# Patient Record
Sex: Female | Born: 1940 | Race: White | Hispanic: No | Smoking: Never smoker
Health system: Southern US, Community
[De-identification: ages and names within clinical notes are randomized; demographics above are authoritative.]

## PROBLEM LIST (undated history)

## (undated) DIAGNOSIS — C801 Malignant (primary) neoplasm, unspecified: Secondary | ICD-10-CM

## (undated) DIAGNOSIS — T7840XA Allergy, unspecified, initial encounter: Secondary | ICD-10-CM

## (undated) DIAGNOSIS — M1990 Unspecified osteoarthritis, unspecified site: Secondary | ICD-10-CM

## (undated) DIAGNOSIS — J45909 Unspecified asthma, uncomplicated: Secondary | ICD-10-CM

## (undated) DIAGNOSIS — Z5189 Encounter for other specified aftercare: Secondary | ICD-10-CM

## (undated) DIAGNOSIS — I10 Essential (primary) hypertension: Secondary | ICD-10-CM

## (undated) DIAGNOSIS — E785 Hyperlipidemia, unspecified: Secondary | ICD-10-CM

## (undated) DIAGNOSIS — E079 Disorder of thyroid, unspecified: Secondary | ICD-10-CM

## (undated) DIAGNOSIS — I890 Lymphedema, not elsewhere classified: Secondary | ICD-10-CM

## (undated) DIAGNOSIS — I83893 Varicose veins of bilateral lower extremities with other complications: Secondary | ICD-10-CM

## (undated) DIAGNOSIS — E119 Type 2 diabetes mellitus without complications: Secondary | ICD-10-CM

## (undated) HISTORY — PX: CHOLECYSTECTOMY: SHX55

## (undated) HISTORY — DX: Type 2 diabetes mellitus without complications: E11.9

## (undated) HISTORY — PX: ABDOMINAL HYSTERECTOMY: SHX81

## (undated) HISTORY — DX: Unspecified asthma, uncomplicated: J45.909

## (undated) HISTORY — DX: Encounter for other specified aftercare: Z51.89

## (undated) HISTORY — PX: COSMETIC SURGERY: SHX468

## (undated) HISTORY — PX: MASTECTOMY: SHX3

## (undated) HISTORY — DX: Malignant (primary) neoplasm, unspecified: C80.1

## (undated) HISTORY — DX: Hyperlipidemia, unspecified: E78.5

## (undated) HISTORY — DX: Unspecified osteoarthritis, unspecified site: M19.90

## (undated) HISTORY — DX: Varicose veins of bilateral lower extremities with other complications: I83.893

## (undated) HISTORY — DX: Lymphedema, not elsewhere classified: I89.0

## (undated) HISTORY — DX: Allergy, unspecified, initial encounter: T78.40XA

## (undated) HISTORY — PX: BREAST SURGERY: SHX581

---

## 2016-01-29 ENCOUNTER — Encounter (HOSPITAL_COMMUNITY): Payer: Self-pay | Admitting: Emergency Medicine

## 2016-01-29 ENCOUNTER — Emergency Department (HOSPITAL_COMMUNITY): Payer: Medicare (Managed Care)

## 2016-01-29 ENCOUNTER — Emergency Department (HOSPITAL_COMMUNITY)
Admission: EM | Admit: 2016-01-29 | Discharge: 2016-01-29 | Disposition: A | Discharge status: Home / Self Care | Payer: Medicare (Managed Care) | Attending: Emergency Medicine | Admitting: Emergency Medicine

## 2016-01-29 DIAGNOSIS — W25XXXA Contact with sharp glass, initial encounter: Secondary | ICD-10-CM | POA: Diagnosis not present

## 2016-01-29 DIAGNOSIS — Y929 Unspecified place or not applicable: Secondary | ICD-10-CM | POA: Diagnosis not present

## 2016-01-29 DIAGNOSIS — S90851A Superficial foreign body, right foot, initial encounter: Secondary | ICD-10-CM | POA: Diagnosis not present

## 2016-01-29 DIAGNOSIS — Z23 Encounter for immunization: Secondary | ICD-10-CM | POA: Diagnosis not present

## 2016-01-29 DIAGNOSIS — I10 Essential (primary) hypertension: Secondary | ICD-10-CM | POA: Insufficient documentation

## 2016-01-29 DIAGNOSIS — Y93G3 Activity, cooking and baking: Secondary | ICD-10-CM | POA: Insufficient documentation

## 2016-01-29 DIAGNOSIS — Y999 Unspecified external cause status: Secondary | ICD-10-CM | POA: Insufficient documentation

## 2016-01-29 HISTORY — DX: Essential (primary) hypertension: I10

## 2016-01-29 HISTORY — DX: Disorder of thyroid, unspecified: E07.9

## 2016-01-29 MED ORDER — BACITRACIN ZINC 500 UNIT/GM EX OINT
TOPICAL_OINTMENT | CUTANEOUS | Status: AC
  Filled 2016-01-29: qty 0.9

## 2016-01-29 MED ORDER — LIDOCAINE-EPINEPHRINE (PF) 2 %-1:200000 IJ SOLN
10.0000 mL | Freq: Once | INTRAMUSCULAR | Status: AC
  Administered 2016-01-29: 10 mL via INTRADERMAL
  Filled 2016-01-29: qty 20

## 2016-01-29 MED ORDER — TETANUS-DIPHTH-ACELL PERTUSSIS 5-2.5-18.5 LF-MCG/0.5 IM SUSP
0.5000 mL | Freq: Once | INTRAMUSCULAR | Status: AC
  Administered 2016-01-29: 0.5 mL via INTRAMUSCULAR
  Filled 2016-01-29: qty 0.5

## 2016-01-29 MED ORDER — BACITRACIN ZINC 500 UNIT/GM EX OINT
TOPICAL_OINTMENT | Freq: Once | CUTANEOUS | Status: AC
  Administered 2016-01-29: 1 via TOPICAL

## 2016-01-29 NOTE — ED Provider Notes (Signed)
[facility] DEPT Provider Note   CSN: [id number] Arrival date & time: 01/29/16  [id number]  First Provider Contact:  None       History   Chief Complaint Chief Complaint  Patient presents with  . Foreign Body    HPI Teresa Anderson is a 75 y.o. female.  HPI   Patient is a 75 year old female with a history of HTN and thyroid disease whr presents the ED with foreign body in right heel. She states she stepped on something 30 minutes PTA. She is experiencing mild intermittent pain at rest, pain is worse with ambulation or pressure on her heel. No associated symptoms. She's not tried anything for the pain. She states her neighbor has tried to remove the foreign body without success. She denies numbness/timing, weakness, fever.  Past Medical History:  Diagnosis Date  . Hypertension   . Thyroid disease     There are no active problems to display for this patient.   Past Surgical History:  Procedure Laterality Date  . ABDOMINAL HYSTERECTOMY    . CESAREAN SECTION     x4  . CHOLECYSTECTOMY    . MASTECTOMY     bilateral    OB History    No data available       Home Medications    Prior to Admission medications   Not on File    Family History History reviewed. No pertinent family history.  Social History Social History  Substance Use Topics  . Smoking status: Never Smoker  . Smokeless tobacco: Never Used  . Alcohol use No     Allergies   Demerol [meperidine]; Morphine and related; Penicillins; and Sulfa antibiotics   Review of Systems Review of Systems  Constitutional: Negative for fever.  Gastrointestinal: Negative for nausea and vomiting.  Skin: Positive for wound.  Neurological: Negative for weakness and numbness.     Physical Exam Updated Vital Signs Pulse 84   Temp 97.8 F (36.6 C) (Oral)   Resp 16   Ht 5\' 4"  (1.626 m)   Wt 90.7 kg   SpO2 100%   BMI 34.33 kg/m   Physical Exam  Constitutional: She appears well-developed and  well-nourished. No distress.  HENT:  Head: Normocephalic and atraumatic.  Eyes: Conjunctivae are normal.  Pulmonary/Chest: Effort normal. No respiratory distress.  Musculoskeletal: Normal range of motion.  Puncture wound noted to right heel that is TTP, full range of motion of ankle and toes, no surrounding erythema, edema or warmth, no signs of infection, patient neurovascularly intact distally  Neurological: She is alert. Coordination normal.  Skin: Skin is warm and dry. She is not diaphoretic.  Psychiatric: She has a normal mood and affect. Her behavior is normal.  Nursing note and vitals reviewed.    ED Treatments / Results  Labs (all labs ordered are listed, but only abnormal results are displayed) Labs Reviewed - No data to display  EKG  EKG Interpretation None       Radiology Dg Foot Complete Right  Result Date: 01/29/2016 CLINICAL DATA:  Right heel pain. Stepped on glass today with glass in foot. EXAM: RIGHT FOOT COMPLETE - 3+ VIEW COMPARISON:  None. FINDINGS: There is a 6 mm foreign body in the plantar subcutaneous tissues subjacent to the mid calcaneus. No tracking soft tissue air. No acute osseous abnormalities. There is midfoot degenerative change and a prominent plantar calcaneal spur. Hammertoe deformity of second toe. IMPRESSION: 6 mm foreign body in the plantar soft tissues subjacent to the calcaneus. Electronically Signed  By: Jeb Levering M.D.   On: 01/29/2016 19:20    Procedures .Foreign Body Removal Date/Time: 01/29/2016 9:01 PM Performed by: Claris Gower, Sameeha Rockefeller L Authorized by: Jackson Latino L  Consent: Verbal consent obtained. Risks and benefits: risks, benefits and alternatives were discussed Consent given by: patient Patient understanding: patient states understanding of the procedure being performed Required items: required blood products, implants, devices, and special equipment available Patient identity confirmed: verbally with patient and arm  band Time out: Immediately prior to procedure a "time out" was called to verify the correct patient, procedure, equipment, support staff and site/side marked as required. Body area: skin General location: lower extremity Location details: right foot Anesthesia: local infiltration  Anesthesia: Local Anesthetic: lidocaine 2% with epinephrine Anesthetic total: 2 mL  Sedation: Patient sedated: no Patient cooperative: yes Localization method: visualized (xray) Removal mechanism: forceps Dressing: antibiotic ointment and dressing applied Depth: subcutaneous Complexity: simple 1 objects recovered. Objects recovered: glass Post-procedure assessment: foreign body removed Patient tolerance: Patient tolerated the procedure well with no immediate complications   (including critical care time)  Medications Ordered in ED Medications  bacitracin 500 UNIT/GM ointment (not administered)  Tdap (BOOSTRIX) injection 0.5 mL (0.5 mLs Intramuscular Given 01/29/16 1928)  lidocaine-EPINEPHrine (XYLOCAINE W/EPI) 2 %-1:200000 (PF) injection 10 mL (10 mLs Intradermal Given 01/29/16 2048)  bacitracin ointment (1 application Topical Given 01/29/16 2048)     Initial Impression / Assessment and Plan / ED Course  I have reviewed the triage vital signs and the nursing notes.  Pertinent labs & imaging results that were available during my care of the patient were reviewed by me and considered in my medical decision making (see chart for details).  Clinical Course   Patient presents with foreign body in bottom of right foot. Foreign body visualized on x-ray. Was able to remove the foreign body. Pt tolerated the procedure well. Discussed signs of infection and reasons to return immediately to the ER. Instructed patient to follow-up at the [facility] in 2 days to have wound rechecked. Patient expressed understanding to the discharge instructions.  Final Clinical Impressions(s) /  ED Diagnoses   Final diagnoses:  Foreign body in right foot, initial encounter    New Prescriptions There are no discharge medications for this patient.    Kalman Drape, PA 01/29/16 2106    Milton Ferguson, MD 01/30/16 1524

## 2016-01-29 NOTE — Discharge Instructions (Signed)
Keep your wound clean and dry and do not submerge in a tub, pool or body of water until completely healed. Change your bandage twice daily and use bacitracin ointment. Follow up with the [facility] in 2 days to have your wound rechecked.  Return to the emergency department if you experience signs of infection to include redness, swelling, pain, discharge, fever or any other concerning symptoms.

## 2016-01-29 NOTE — ED Triage Notes (Signed)
Pt reports was cooking dinner and reported stepped on glass. Laceration ntoed to left heel. Pt reports "it still feels like its in my foot." dried blood noted to bottom of left foot. Pt reports last tetanus shot x10 years.

## 2016-03-16 ENCOUNTER — Encounter: Payer: Self-pay | Admitting: Family Medicine

## 2016-03-16 ENCOUNTER — Ambulatory Visit (INDEPENDENT_AMBULATORY_CARE_PROVIDER_SITE_OTHER): Payer: Medicare Other | Admitting: Family Medicine

## 2016-03-16 VITALS — BP 138/74 | HR 64 | Resp 18 | Ht 63.5 in | Wt 214.0 lb

## 2016-03-16 DIAGNOSIS — Z96653 Presence of artificial knee joint, bilateral: Secondary | ICD-10-CM

## 2016-03-16 DIAGNOSIS — I890 Lymphedema, not elsewhere classified: Secondary | ICD-10-CM

## 2016-03-16 DIAGNOSIS — E039 Hypothyroidism, unspecified: Secondary | ICD-10-CM | POA: Diagnosis not present

## 2016-03-16 DIAGNOSIS — Z7189 Other specified counseling: Secondary | ICD-10-CM | POA: Diagnosis not present

## 2016-03-16 DIAGNOSIS — I10 Essential (primary) hypertension: Secondary | ICD-10-CM | POA: Diagnosis not present

## 2016-03-16 DIAGNOSIS — I83813 Varicose veins of bilateral lower extremities with pain: Secondary | ICD-10-CM

## 2016-03-16 DIAGNOSIS — E669 Obesity, unspecified: Secondary | ICD-10-CM

## 2016-03-16 DIAGNOSIS — G894 Chronic pain syndrome: Secondary | ICD-10-CM

## 2016-03-16 DIAGNOSIS — Z7689 Persons encountering health services in other specified circumstances: Secondary | ICD-10-CM

## 2016-03-16 DIAGNOSIS — C50919 Malignant neoplasm of unspecified site of unspecified female breast: Secondary | ICD-10-CM | POA: Insufficient documentation

## 2016-03-16 DIAGNOSIS — R7303 Prediabetes: Secondary | ICD-10-CM

## 2016-03-16 DIAGNOSIS — M47816 Spondylosis without myelopathy or radiculopathy, lumbar region: Secondary | ICD-10-CM

## 2016-03-16 DIAGNOSIS — E785 Hyperlipidemia, unspecified: Secondary | ICD-10-CM

## 2016-03-16 MED ORDER — OXYCODONE HCL 30 MG PO TABS: 30.0000 mg | ORAL_TABLET | Freq: Four times a day (QID) | ORAL | 0 refills | Status: DC | PRN

## 2016-03-16 MED ORDER — LEVOTHYROXINE SODIUM 175 MCG PO TABS: 175.0000 ug | ORAL_TABLET | Freq: Every day | ORAL | 3 refills | Status: DC

## 2016-03-16 MED ORDER — ATENOLOL 50 MG PO TABS: 50.0000 mg | ORAL_TABLET | Freq: Every day | ORAL | 3 refills | Status: DC

## 2016-03-16 MED ORDER — HYDROCHLOROTHIAZIDE 25 MG PO TABS: 25.0000 mg | ORAL_TABLET | Freq: Every day | ORAL | 3 refills | Status: DC

## 2016-03-16 NOTE — Progress Notes (Signed)
Chief Complaint  Patient presents with  . Establish Care    previous pcp in [facility]    Complicated medical patient added in today because she is out of medications. She previously lived in [facility] and has moved to the area couple months ago. She does bring with her a large number of old records that are briefly reviewed. She has well-controlled hypertension. She is on atenolol and hydrochlorothiazide. She is compliant with these medications. She denies any cardiovascular disease or heart problems. She has a history of mild hyperlipidemia. Her doctor told her that it did not require treatment. Her last LDL was 147 with HDL under 60. Her calculated Framingham risk of developing cardiovascular disease over the next 10 years is over 20%. I recommend a statin today. She refused. We discussed that she also has prediabetes. This would further increase her risk of cardiovascular disease. She has not good about dieting. She does not know what limitation she should place on her food, or how to count carbs. I have recommended to her a diabetes educator. We will set this up at her next visit. Patient has chronic pain. She is on oxycodone 30 mg 3 times a day. I explained to her that this is a lot of pain medication. 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 She states the pain is and her low back. At this time she states the pain is in her legs. She states it is from "lymphedema" and "varicose veins". She also has multiple complaints in her old records of breast pain. I never heard of varicose veins causing severe enough pain to require this much oxycodone. She agrees to go to the lymphedema physical therapy specialist and to vascular surgeon for consultation. She has multiple visits for pain management with negative drug screens and no drug-seeking behaviors documented in her old medical records. Patient is morbidly obese. I'm sure that this is affecting her pain management, hyperlipidemia,  prediabetes and hypertension. The importance of portion control and losing weight is discussed with her. She has been on Weight Watchers multiple times. She states it "doesn't work". She feels unable to exercise because of her severe pain. She is hypothyroid. She is on levothyroxine 150 g a day. Her most recent TSH was over 15. I am increasing her dose to 175 g a day. We will repeat a TSH in 6-8 weeks. She has noted hair loss, and fatigue. Patient had a double mastectomy for bilateral breast cancer, with breast reconstruction. She no longer gets mammograms. There is a DEXA scan on the chart that was normal within the last couple of years. She has refused colonoscopy screening. I explained to her that breast cancer survivors are at higher risk for colon cancer in the colon cancer screening is important. Patient refuses immunizations. I explained her that with her other medical illnesses, influenza or pneumonia would make her miserable and dreadfully ill, and make her pain management very difficult. She agreed to get the injections today, and then declined at the last minute stating she would get them "next time. Patient had bilateral knee replacements. She states they were not done properly. She has consistent ongoing knee pain. It has been recommended to her to redo her surgery. She is not interested.  Patient Active Problem List   Diagnosis Date Noted  . Essential hypertension 03/16/2016  . Lymphedema 03/16/2016  . Breast cancer in female [facility]) 03/16/2016  . Hypothyroidism 03/16/2016  . Degenerative joint disease (DJD) of lumbar  spine 03/16/2016  . 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 Prediabetes 03/16/2016  . Obesity 03/16/2016  . Hyperlipidemia 03/16/2016  . Chronic pain syndrome 03/16/2016    Outpatient Encounter Prescriptions as of 03/16/2016  Medication Sig  . atenolol (TENORMIN) 50 MG tablet Take 1 tablet (50 mg total) by mouth daily.  . hydrochlorothiazide (HYDRODIURIL)  25 MG tablet Take 1 tablet (25 mg total) by mouth daily.  Marland Kitchen oxycodone (ROXICODONE) 30 MG immediate release tablet Take 1 tablet (30 mg total) by mouth every 6 (six) hours as needed for pain. Maximum daily dose 3 tabs  . levothyroxine (SYNTHROID) 175 MCG tablet Take 1 tablet (175 mcg total) by mouth daily before breakfast.   No facility-administered encounter medications on file as of 03/16/2016.     Past Medical History:  Diagnosis Date  . Allergy   . Arthritis   . Asthma   . Blood transfusion without reported diagnosis    with cancer surgery  . Cancer ([place])    breast and skin  . Diabetes mellitus without complication ([place])   . Hyperlipidemia   . Hypertension   . Lymphedema of leg   . Thyroid disease     Past Surgical History:  Procedure Laterality Date  . ABDOMINAL HYSTERECTOMY    . BREAST SURGERY     bilat  . CESAREAN SECTION     x4  . CHOLECYSTECTOMY    . COSMETIC SURGERY     reconstruction  . MASTECTOMY     bilateral    Social History   Social History  . Marital status: Divorced    Spouse name: N/A  . Number of children: 5  . Years of education: 15   Occupational History  . Statistician     retired  . pre school    Social History Main Topics  . Smoking status: Never Smoker  . Smokeless tobacco: Never Used  . Alcohol use No  . Drug use: No  . Sexual activity: Not Currently   Other Topics Concern  . Not on file   Social History Narrative  . No narrative on file    Family History  Problem Relation Age of Onset  . Heart disease Mother 41    heart attack  . COPD Father   . Cancer Father   . Celiac disease Daughter   . Cancer Maternal Aunt     lung-smoker    Review of Systems  Constitutional: Negative for chills, fever and weight loss.       Fatigue  HENT: Negative for congestion and hearing loss.   Eyes: Negative for blurred vision and pain.       Regular eye exams  Respiratory: Negative for cough and shortness of breath.     Cardiovascular: Negative for chest pain and leg swelling.  Gastrointestinal: Negative for abdominal pain, blood in stool, constipation, diarrhea and heartburn.  Genitourinary: Negative for dysuria and frequency.  Musculoskeletal: Positive for back pain, joint pain and myalgias. Negative for falls.  Skin: Negative for rash.  Neurological: Negative for dizziness, seizures and headaches.  Endo/Heme/Allergies: Positive for environmental allergies.  Psychiatric/Behavioral: Negative for depression. The patient is not nervous/anxious and does not have insomnia.     BP 138/74   Pulse 64   Resp 18   Ht 5' 3.5" (1.613 m)   Wt 214 lb 0.6 oz (97.1 kg)   SpO2 93%   BMI 37.32 kg/m   Physical Exam  Constitutional: She is oriented to person, place,  and time. She appears well-developed and well-nourished. She appears distressed.  Morbidly obese. Obviously uncomfortable. Paces the floor.  HENT:  Head: Normocephalic and atraumatic.  Mouth/Throat: Oropharynx is clear and moist.  Eyes: Conjunctivae are normal. Pupils are equal, round, and reactive to light.  Neck: Normal range of motion. No thyromegaly present.  Cardiovascular: Normal rate, regular rhythm and normal heart sounds.   Systolic murmur at RUSB  Pulmonary/Chest: Effort normal and breath sounds normal.  Musculoskeletal: Normal range of motion. She exhibits no edema.  Patient can easily touch her toes and completely erect. She does have bilateral significant venous varicosities visible. She does have adipose deposits in her legs that she refers to as "lymphedema" was quite narrow ankles and no pitting edema visible.  Lymphadenopathy:    She has no cervical adenopathy.  Neurological: She is alert and oriented to person, place, and time.  Psychiatric: She has a normal mood and affect. Her behavior is normal. Thought content normal.   ASSESSMENT/PLAN:   1. Encounter to establish care with new doctor   2. Essential  hypertension controlled  3. Lymphedema  - Ambulatory referral to Physical Therapy  4. 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 History of bilateral knee replacement   7. Prediabetes Diet discussed.  Referral to educator recomended  8. Obesity   9. Varicose veins of both lower extremities with pain  - Ambulatory referral to Vascular Surgery  10. hyperlipidemia Refuses statin  11. Chronic pain syndrome Discussed with patient that I will free refill her oxycodone today. I'm going to consultation regarding her varicose veins and whether this is causing her severe pain. She may have other reason for her pain. If she requires ongoing oxycodone she will be referred for pain management.   Patient Instructions  See me in one month Immunizations next visit We will consider a consultation with a pain specialist I will refer for lymphedema therapy I will refer for vascular surgery consultation. I believe you would benefit from a diabetes nutrition educator  I have refilled your medicine  The new synthroid is 175 mcg a day Take on empty stomach Need a new TSH blood test in 6-8 weeks   Raylene Everts, MD 60 minutes was spent with patient. More than 50% of this time was spent in reviewing records, and educating her on proper care for diabetes, and the evaluation and treatment of her chronic pain condition. We discussed the value immunizations and preventive medicine/colonoscopy.

## 2016-03-16 NOTE — Patient Instructions (Addendum)
See me in one month Immunizations next visit We will consider a consultation with a pain specialist I will refer for lymphedema therapy I will refer for vascular surgery consultation. I believe you would benefit from a diabetes nutrition educator  I have refilled your medicine  The new synthroid is 175 mcg a day Take on empty stomach Need a new TSH blood test in 6-8 weeks

## 2016-03-27 ENCOUNTER — Ambulatory Visit (HOSPITAL_COMMUNITY): Payer: Medicare Other | Attending: Family Medicine | Admitting: Physical Therapy

## 2016-03-27 DIAGNOSIS — M79605 Pain in left leg: Secondary | ICD-10-CM | POA: Diagnosis not present

## 2016-03-27 DIAGNOSIS — I890 Lymphedema, not elsewhere classified: Secondary | ICD-10-CM

## 2016-03-27 DIAGNOSIS — R262 Difficulty in walking, not elsewhere classified: Secondary | ICD-10-CM | POA: Diagnosis not present

## 2016-03-27 DIAGNOSIS — M79604 Pain in right leg: Secondary | ICD-10-CM | POA: Insufficient documentation

## 2016-03-27 NOTE — Therapy (Addendum)
[facility] [address] [place], [place], [address] Phone: [phone]   Fax:  [phone]  Physical Therapy Evaluation  Patient Details  Name: Teresa Anderson MRN: [id number] Date of Birth: 05/30/1941 Referring Provider: Yvonne Nelson  Encounter Date: 03/27/2016      PT End of Session - 03/27/16 1636    Visit Number 1   Number of Visits 12   Date for PT Re-Evaluation 04/26/16   Authorization Type [employer] medicare    Authorization - Visit Number 1   Authorization - Number of Visits 10   PT Start Time 1040   PT Stop Time 1134   PT Time Calculation (min) 54 min   Activity Tolerance Patient tolerated treatment well   Behavior During Therapy WFL for tasks assessed/performed      Past Medical History:  Diagnosis Date  . Allergy   . Arthritis   . Asthma   . Blood transfusion without reported diagnosis    with cancer surgery  . Cancer (HCC)    breast and skin  . Diabetes mellitus without complication (HCC)   . Hyperlipidemia   . Hypertension   . Lymphedema of leg   . Thyroid disease     Past Surgical History:  Procedure Laterality Date  . ABDOMINAL HYSTERECTOMY    . BREAST SURGERY     bilat  . CESAREAN SECTION     x4  . CHOLECYSTECTOMY    . COSMETIC SURGERY     reconstruction  . MASTECTOMY     bilateral    There were no vitals filed for this visit.       Subjective Assessment - 03/27/16 1045    Subjective Ms. Diven states that she has been diagnosed with lymphedema for about a year but has had it for a long time and did not know it.   She states that she can not get compression hose on due to incision on her lower abdominal area that is to sensitive for panty hose and thigh high compression falling down on a constant basis.    She has recently moved to [place] from [place].  She was going to a lymphedema therapist in [place] where she received manual and time in a compression pump.     Pertinent History  HTN, back pain    How long can you sit comfortably? Pt states she does not sit.  States that she does not sit to eat,   How long can you stand comfortably? 10-15 minutes    How long can you walk comfortably? less than five minutes    Patient Stated Goals less pain; more functional   Currently in Pain? Yes   Pain Score 9    Pain Location Leg   Pain Orientation Right;Left   Pain Descriptors / Indicators Aching;Throbbing   Pain Type Chronic pain   Pain Onset More than a month ago   Pain Frequency Constant   Aggravating Factors  everything    Pain Relieving Factors nothing    Effect of Pain on Daily Activities limits significantly             [facility] PT Assessment - 03/27/16 0001      Assessment   Medical Diagnosis Bilateral lymphedema   Referring Provider Yvonne Nelson   Onset Date/Surgical Date 02/28/16   Next MD Visit 04/26/2016   Prior Therapy Out patient in [place]      Precautions   Precautions None     Restrictions     Weight Bearing Restrictions No     Balance Screen   Has the patient fallen in the past 6 months No   Has the patient had a decrease in activity level because of a fear of falling?  Yes   Is the patient reluctant to leave their home because of a fear of falling?  No     Home Environment   Living Environment Private residence   Home Access Level entry   Home Layout One level     Prior Function   Level of Independence Independent   Vocation Retired   Leisure sewing, making greeting cards, cook      Cognition   Overall Cognitive Status Within Functional Limits for tasks assessed     Observation/Other Assessments   Observations Pt + for hyperalgesia     Palpation   Palpation comment palpable adipose lobules and loose tissue      Life Impact score:  86      LYMPHEDEMA/ONCOLOGY QUESTIONNAIRE - 03/27/16 1058      What other symptoms do you have   Are you Having Heaviness or Tightness Yes   Are you having Pain Yes   Is it Hard or Difficult  finding clothes that fit Yes   Do you have infections No   Stemmer Sign Yes     Lymphedema Stage   Stage STAGE 2 SPONTANEOUSLY IRREVERSIBLE     Lymphedema Assessments   Lymphedema Assessments Lower extremities     Right Lower Extremity Lymphedema   At Groin Measure at Horizontal from Pubic Bone 67.2 cm   20 cm Proximal to Suprapatella 66.5 cm   10 cm Proximal to Suprapatella 63 cm   At Midpatella/Popliteal Crease 44 cm   30 cm Proximal to Floor at Lateral Plantar Foot 49.3 cm   20 cm Proximal to Floor at Lateral Plantar Foot 29 1   10 cm Proximal to Floor at Lateral Malleoli 21.3 cm   Circumference of ankle/heel 32.6 cm.   5 cm Proximal to 1st MTP Joint 24 cm   Across MTP Joint 24.7 cm   Around Proximal Great Toe 8.9 cm     Left Lower Extremity Lymphedema   At Groin Measure at Horizontal from Pubic Bone 66.8 cm   30 cm Proximal to Suprapatella 67.8 cm   20 cm Proximal to Suprapatella 65.3 cm   10 cm Proximal to Suprapatella 57.5 cm   At Midpatella/Popliteal Crease 49.5 cm   30 cm Proximal to Floor at Lateral Plantar Foot 45.3 cm   20 cm Proximal to Floor at Lateral Plantar Foot 28.1 cm   10 cm Proximal to Floor at Lateral Malleoli 22.1 cm   Circumference of ankle/heel 33.2 cm.   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  Pt encouraged to join the [employer] and walk in the pool    Person(s) Educated Patient   Methods Explanation   Comprehension Verbalized understanding          PT Short Term Goals - 03/27/16 1645      PT SHORT TERM GOAL #1     Title Pt to verbalize that she is completing her exercises and is going to the pool to improve compression on LE for reduced volume.    Time 1   Period Weeks    Status New     PT SHORT TERM GOAL #2   Title PT to have obtain and verbalize the care for her compression garments   Time 2   Period Weeks   Status New     PT SHORT TERM GOAL #3   Title Pt pain level to be no greater than a 5/10 to allow pt to be more mobile in her home.    Time 3   Period Weeks           PT Long Term Goals - 03/27/16 1647      PT LONG TERM GOAL #1   Title Pt to have lost an average of 3.5 cm of fluid in both LE  to allow pt to be able to walk with greater ease.    Time 6   Period Weeks   Status New     PT LONG TERM GOAL #2   Title Pt to have capri's and knee high compression garment for  maintenance of fluid in B LE    Time 6   Period Weeks               Plan - 03/27/16 1637    Clinical Impression Statement Ms. Pagliarulo is a 75 yo female who has recently moved here from [place].  She was told that she had lymphedema and was seeing a therapist in [place] for manual and compression pump treatment with no compression bandaging or compression garments. She has now been referred for skilled physical therapy for lymphedema treatment.  Upon examination it was noted that Ms. Dobias shows more signs of lipedema with venous insufficiency thatn lymphedema.  It was explained that typically manual will not benefit but she should benefit from compression bandaging and compression garment.  Pt was given sheet of the bandages that she will need to supply and we will begin bandaging.   We will try some manual to see if we feel it is beneficial but, again most benefit should come from the compression bandaging/garments.    Rehab Potential Fair   PT Frequency 3x / week   PT Duration 4 weeks   PT Treatment/Interventions ADLs/Self Care Home Management;Patient/family education;Manual techniques;Manual lymph drainage;Compression bandaging;Therapeutic activities;Therapeutic exercise   PT Next Visit Plan give LE exercises to increase lymphatic circulation, cut foam and  begin compression bandaging.    Consulted and Agree with Plan of Care Patient      Patient will benefit from skilled therapeutic intervention in order to improve the following deficits and impairments:  Decreased activity tolerance, Decreased balance, Decreased strength, Difficulty walking, Increased edema, Obesity, Pain  Visit Diagnosis: Pain In Right Leg  Pain In Left Leg  Difficulty in walking, not elsewhere classified      G-Codes - 03/27/16 1650    Functional Assessment Tool Used life impact score    Functional Limitation Other PT primary   Self Care Current Status (G8987) At least 60 percent but less than 80 percent impaired, limited or restricted   Self Care Goal Status (G8988) Self Care Discharge   (G8989) At least 60 percent but less than 80 percent impaired, limited or restricted 60-80% impaired          Problem List Patient Active Problem List   Diagnosis Date Noted  .   Essential hypertension 03/16/2016  . Lymphedema 03/16/2016  . Breast cancer in female [facility]) 03/16/2016  . Hypothyroidism 03/16/2016  . 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 Prediabetes 03/16/2016  . Obesity 03/16/2016  . Hyperlipidemia 03/16/2016  . Chronic pain syndrome 03/16/2016   Rayetta Humphrey, PT CLT [phone] 03/27/2016, 4:51 PM  [facility] [address] [place], [place], [address] Phone: [phone]   Fax:  [phone]  Name: Leva Baine MRN: [id number] Date of Birth: [birth year]  02/16/2017 PHYSICAL THERAPY DISCHARGE SUMMARY  Visits from Start of Care: 1  Current functional level related to goals / functional outcomes: See evaluation   Remaining deficits: See evaluation    Education / Equipment: As above   Plan: Patient agrees to discharge.  Patient goals were not met. Patient is being discharged due to not returning since the last visit.   ?????        Rayetta Humphrey, [place] CLT [phone]

## 2016-03-27 NOTE — Addendum Note (Signed)
Addended by: Leeroy Cha on: 03/27/2016 05:00 PM   Modules accepted: Orders

## 2016-04-10 ENCOUNTER — Telehealth: Payer: Self-pay | Admitting: Family Medicine

## 2016-04-10 NOTE — Telephone Encounter (Signed)
Patient is asking for a med refill on her Ibuprofren 800mg , she states that her previous office in [place] originally wrote the Rx and she is asking if Dr. Meda Coffee would refill it, please advise?

## 2016-04-15 ENCOUNTER — Ambulatory Visit (INDEPENDENT_AMBULATORY_CARE_PROVIDER_SITE_OTHER): Payer: Medicare Other | Admitting: Family Medicine

## 2016-04-15 ENCOUNTER — Encounter: Payer: Self-pay | Admitting: Family Medicine

## 2016-04-15 ENCOUNTER — Telehealth: Payer: Self-pay | Admitting: Family Medicine

## 2016-04-15 VITALS — BP 136/82 | HR 60 | Temp 98.0°F | Resp 18 | Ht 64.0 in | Wt 215.8 lb

## 2016-04-15 DIAGNOSIS — R7303 Prediabetes: Secondary | ICD-10-CM

## 2016-04-15 DIAGNOSIS — E039 Hypothyroidism, unspecified: Secondary | ICD-10-CM | POA: Diagnosis not present

## 2016-04-15 DIAGNOSIS — I10 Essential (primary) hypertension: Secondary | ICD-10-CM | POA: Diagnosis not present

## 2016-04-15 DIAGNOSIS — G894 Chronic pain syndrome: Secondary | ICD-10-CM

## 2016-04-15 DIAGNOSIS — L719 Rosacea, unspecified: Secondary | ICD-10-CM

## 2016-04-15 MED ORDER — OXYCODONE HCL 30 MG PO TABS: 30.0000 mg | ORAL_TABLET | Freq: Four times a day (QID) | ORAL | 0 refills | Status: DC | PRN

## 2016-04-15 MED ORDER — PREGABALIN 25 MG PO CAPS: 25.0000 mg | ORAL_CAPSULE | Freq: Two times a day (BID) | ORAL | 2 refills | Status: DC

## 2016-04-15 MED ORDER — METRONIDAZOLE 1 % EX GEL: Freq: Every day | CUTANEOUS | 1 refills | Status: DC

## 2016-04-15 MED ORDER — IBUPROFEN 800 MG PO TABS: 800.0000 mg | ORAL_TABLET | Freq: Three times a day (TID) | ORAL | 1 refills | Status: DC

## 2016-04-15 NOTE — Progress Notes (Signed)
Chief Complaint  Patient presents with  . Follow-up    1 month   Here for follow up Is requesting he oxycodone be increased to 4x a day.  I explained that she requested this last time and I chose to only give her TID due to my concern regarding her very high dose of narcotic. She has not yet seen vascular surgery. She has seen Pt.  The therapist had an interesting observation discussed with patient.  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  It is a disorder of fat deposition, and it is often painful.   It will not be improved with compression as much as exercise and weight loss. She is having trouble losing weight, and is pre diabetic, so I am referring her to nutrition for consultation. Regular swimming exercise at [facility] is also encouraged We discussed her leg pain.  She also been told it is due to nerve pain from a low back problem/disc disease.  She had a MRI and has had nerve conduction studies. She has been on lyrica which caused sedation.  Is willing to try again.  Could not tolerate gabapentin. I inquired about a trial of cymbalta, but she says all antidepressants make her throw up.  This is still a possibility after lyrica. New problem of face redness and acneiform rash.  Patient Active Problem List   Diagnosis Date Noted  . Essential hypertension 03/16/2016  . Lymphedema 03/16/2016  . Breast cancer in female [facility]) 03/16/2016  . Hypothyroidism 03/16/2016  . 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 Prediabetes 03/16/2016  . Obesity 03/16/2016  . Hyperlipidemia 03/16/2016  . Chronic pain syndrome 03/16/2016    Outpatient Encounter Prescriptions as of 04/15/2016  Medication Sig  . atenolol (TENORMIN) 50 MG tablet Take 1 tablet (50 mg total) by mouth daily.  . hydrochlorothiazide (HYDRODIURIL) 25 MG tablet Take 1 tablet (25 mg total) by mouth daily.    Marland Kitchen ibuprofen (ADVIL,MOTRIN) 800 MG tablet Take 1 tablet (800 mg total) by mouth 3 (three) times daily.  Marland Kitchen levothyroxine (SYNTHROID) 175 MCG tablet Take 1 tablet (175 mcg total) by mouth daily before breakfast.  . oxycodone (ROXICODONE) 30 MG immediate release tablet Take 1 tablet (30 mg total) by mouth every 6 (six) hours as needed for pain. Maximum daily dose 3 tabs  . metroNIDAZOLE (METROGEL) 1 % gel Apply topically daily.  . pregabalin (LYRICA) 25 MG capsule Take 1 capsule (25 mg total) by mouth 2 (two) times daily.   No facility-administered encounter medications on file as of 04/15/2016.     Allergies  Allergen Reactions  . Penicillins Anaphylaxis    Childhood allergy-unknown  . Sulfa Antibiotics Swelling    angioedema  . Demerol [Meperidine] Other (See Comments)    anxiety  . Morphine And Related Itching    side effect     Review of Systems  Constitutional: Negative for activity change, appetite change, fatigue and unexpected weight change.  HENT: Negative for congestion, postnasal drip and rhinorrhea.   Eyes: Negative.  Negative for visual disturbance.  Respiratory: Negative for cough and shortness of breath.   Cardiovascular: Negative for chest pain, palpitations and leg swelling.  Gastrointestinal: Negative for constipation and diarrhea.  Genitourinary: Negative.  Negative for difficulty urinating.  Musculoskeletal: Positive for arthralgias, back pain and gait problem.       Leg pain.  Bone pain  Skin: Positive for rash.       Cheeks and chin  Neurological: Negative for weakness and numbness.  Psychiatric/Behavioral: Negative for dysphoric mood. The patient is not nervous/anxious.    BP 136/82 (BP Location: Left Arm, Patient Position: Sitting, Cuff Size: Large)   Pulse 60   Temp 98 F (36.7 C) (Oral)   Resp 18   Ht 5\' 4"  (1.626 m)   Wt 215 lb 12.8 oz (97.9 kg)   SpO2 98%   BMI 37.04 kg/m   Physical Exam  Constitutional: She is oriented to person, place,  and time. She appears well-developed and well-nourished. No distress.  Morbidly obese. Appears tired  HENT:  Head: Normocephalic and atraumatic.  Mouth/Throat: Oropharynx is clear and moist.  Eyes: Conjunctivae are normal. Pupils are equal, round, and reactive to light.  Neck: Normal range of motion. No thyromegaly present.  Cardiovascular: Normal rate, regular rhythm and normal heart sounds.   Systolic murmur at RUSB  Pulmonary/Chest: Effort normal and breath sounds normal.  Musculoskeletal: Normal range of motion. She exhibits no edema.  She does have bilateral significant venous varicosities visible. She does have adipose deposits in her legs that she refers to as "lymphedema" was quite narrow ankles and no pitting edema visible.  Lymphadenopathy:    She has no cervical adenopathy.  Neurological: She is alert and oriented to person, place, and time.  Skin: Skin is warm and dry.  Mild flush to cheeks with few acne lesions  Psychiatric: She has a normal mood and affect. Her behavior is normal. Thought content normal.    ASSESSMENT/PLAN:  1. Essential hypertension controlled - CBC - Comprehensive metabolic panel - Lipid panel - Urine culture - VITAMIN D 25 Hydroxy (Vit-D Deficiency, Fractures)  2. Hypothyroidism, unspecified type - TSH  3. Prediabetes - Hemoglobin A1c - Amb ref to Medical Nutrition Therapy-MNT  4. Chronic pain syndrome - oxycodone (ROXICODONE) 30 MG immediate release tablet; Take 1 tablet (30 mg total) by mouth every 6 (six) hours as needed for pain. Maximum daily dose 3 tabs  Dispense: 90 tablet; Refill: 0 - ibuprofen (ADVIL,MOTRIN) 800 MG tablet; Take 1 tablet (800 mg total) by mouth 3 (three) times daily.  Dispense: 90 tablet; Refill: 1 - Lyrica 25 mg BID 5. Rosacea -metrogel  Patient Instructions  Take OTC vitamin d 2000 U a day  Take the lyrica daily 25 mg at bedtime Gradually increase this over time When you have taken it several days AND do not  have drowsiness then try one twice a day As tolerated you may increase this to 50 mg twice a day or more Goal eventually is 100 mg twice a day Call for refills  Use the metrogel once a day for the acne (rosacea)  I have called the vascular surgeon to see you  Will pursue a back origin if the vascular surgeon results are not contributory     Raylene Everts, MD

## 2016-04-15 NOTE — Telephone Encounter (Signed)
Copays on medications she cant afford. She is asking if we have a discount card on Lyrica, please advise?

## 2016-04-15 NOTE — Patient Instructions (Signed)
Take OTC vitamin d 2000 U a day  Take the lyrica daily 25 mg at bedtime Gradually increase this over time When you have taken it several days AND do not have drowsiness then try one twice a day As tolerated you may increase this to 50 mg twice a day or more Goal eventually is 100 mg twice a day Call for refills  Use the metrogel once a day for the acne (rosacea)  I have called the vascular surgeon to see you  Will pursue a back origin if the vascular surgeon results are not contributory

## 2016-04-15 NOTE — Telephone Encounter (Signed)
Addressed at visit

## 2016-04-15 NOTE — Telephone Encounter (Signed)
Savings card for Lyrica sent to [facility]

## 2016-04-17 ENCOUNTER — Encounter: Payer: Self-pay | Admitting: Vascular Surgery

## 2016-04-21 ENCOUNTER — Encounter: Payer: Medicare Other | Admitting: Vascular Surgery

## 2016-05-07 ENCOUNTER — Encounter: Payer: Medicare Other | Attending: Family Medicine | Admitting: Nutrition

## 2016-05-07 ENCOUNTER — Encounter: Payer: Self-pay | Admitting: Nutrition

## 2016-05-07 DIAGNOSIS — R739 Hyperglycemia, unspecified: Secondary | ICD-10-CM

## 2016-05-07 DIAGNOSIS — Z713 Dietary counseling and surveillance: Secondary | ICD-10-CM | POA: Insufficient documentation

## 2016-05-07 DIAGNOSIS — Z6837 Body mass index (BMI) 37.0-37.9, adult: Secondary | ICD-10-CM | POA: Insufficient documentation

## 2016-05-07 DIAGNOSIS — R7303 Prediabetes: Secondary | ICD-10-CM | POA: Insufficient documentation

## 2016-05-07 NOTE — Patient Instructions (Addendum)
Goals 1. Eat three meals per day using the Plate Method 2. Cut out the cookie 3. NO snacks between meals unless its vegetables. 4. Eat fruit with meals and not as a snack 5. Lose 1 lbs per week Continue to drink water

## 2016-05-07 NOTE — Progress Notes (Signed)
  Medical Nutrition Therapy:  Appt start time: 1330 end time:  1430.  Assessment:  Primary concerns today  Prediabetes. A1C 5.8 Lives by herself.  Eats 2 meals per day. PMH: Chronic back pain.Wants to try Gluten free diet.  Is in severe pain today. Drives herself to appointments.  Wants to lose weight. Eats sweets at night. Says she drinks a lot of sparking water.  Diet is inconsistent to meet her needs.  Preferred Learning Style:   Visual  Learning Readiness:   Ready  Change in progress   MEDICATIONS: see list   DIETARY INTAKE   24-hr recall:  B ( AM):Oatmeal plain,  2/3 cup,  water Snk ( AM): none  L ( PM): skipped  : cabbage soup,  water Snk ( PM): cookie-pb cookie, water D ( PM):  Nachos and cheese, water Snk ( PM): Cookies- 4 Beverages: water  Usual physical activity: ADL  Estimated energy needs: 1500 calories 170 g carbohydrates 112 g protein 42 g fat  Progress Towards Goal(s):  In progress.   Nutritional Diagnosis:  NB-1.1 Food and nutrition-related knowledge deficit As related to Obesity and prediabetes.  As evidenced by BMI> 40 and A1C 5.8%    Intervention:  Nutrition and Diabetes education provided on My Plate, CHO counting, meal planning, portion sizes, timing of meals, avoiding snacks between meals unless having a low blood sugar, target ranges for A1C and blood sugars, signs/symptoms and treatment of hyper/hypoglycemia,  benefits of exercising 30 minutes per day and prevention of complications of DM. Low Salt Hearty Healthy Gluten Free Diet  Goals 1. Eat three meals per day using the Plate Method 2. Cut out the cookie 3. NO snacks between meals unless its vegetables. 4. Eat fruit with meals and not as a snack 5. Lose 1 lbs per week Continue to drink water  Teaching Method Utilized:  Visual Auditory Hands on  Handouts given during visit include:  The Plate Method   Meal Plan Card  Barriers to learning/adherence to lifestyle change:  none  Demonstrated degree of understanding via:  Teach Back   Monitoring/Evaluation:  Dietary intake, exercise, meal planning, and body weight in 1 month(s).

## 2016-05-11 ENCOUNTER — Telehealth: Payer: Self-pay

## 2016-05-11 ENCOUNTER — Other Ambulatory Visit: Payer: Self-pay

## 2016-05-11 DIAGNOSIS — G894 Chronic pain syndrome: Secondary | ICD-10-CM

## 2016-05-11 MED ORDER — OXYCODONE HCL 30 MG PO TABS: 30.0000 mg | ORAL_TABLET | Freq: Four times a day (QID) | ORAL | 0 refills | Status: DC | PRN

## 2016-05-11 NOTE — Telephone Encounter (Signed)
Medication pended.  Unable to reach patient.     Oxycodone available for signature.

## 2016-05-11 NOTE — Telephone Encounter (Signed)
May refill oxycodone 30 mg TID #90 Please pend gabapentin 300 TID # 90 with one refill (we discussed gabapentin AND lyrica at the visit and she chose lyrica)

## 2016-05-12 ENCOUNTER — Telehealth: Payer: Self-pay | Admitting: Family Medicine

## 2016-05-12 ENCOUNTER — Other Ambulatory Visit: Payer: Self-pay | Admitting: Family Medicine

## 2016-05-12 MED ORDER — GABAPENTIN 300 MG PO CAPS: 300.0000 mg | ORAL_CAPSULE | Freq: Three times a day (TID) | ORAL | 1 refills | Status: DC

## 2016-05-12 NOTE — Progress Notes (Signed)
I have authorized these meds,  Cannot address note.

## 2016-05-12 NOTE — Telephone Encounter (Signed)
Opened in Error.

## 2016-05-27 ENCOUNTER — Encounter: Payer: Self-pay | Admitting: Vascular Surgery

## 2016-06-01 ENCOUNTER — Ambulatory Visit (INDEPENDENT_AMBULATORY_CARE_PROVIDER_SITE_OTHER): Payer: Medicare Other | Admitting: Vascular Surgery

## 2016-06-01 ENCOUNTER — Encounter: Payer: Self-pay | Admitting: Vascular Surgery

## 2016-06-01 VITALS — BP 126/84 | HR 71 | Temp 97.1°F | Resp 16 | Ht 64.0 in | Wt 214.0 lb

## 2016-06-01 DIAGNOSIS — I83893 Varicose veins of bilateral lower extremities with other complications: Secondary | ICD-10-CM

## 2016-06-01 NOTE — Progress Notes (Signed)
Subjective:     Patient ID: Teresa Anderson, female   DOB: 05-05-1941, 75 y.o.   MRN: [id number]  HPI [place] 75 year old female was referred by Dr. Lysle Morales for evaluation of bilateral leg pain and varicose veins. This patient recently moved from [facility] to [place]. She was evaluated in either a vein center or emergency department in November of this year. She had ultrasound exam done-venous of both lower extremities. She has no history of DVT thrombophlebitis stasis ulcers or bleeding. She has been having chronic leg pain for many years in both lower extremities. She is able to walk very short distances. She generally has some improvement in symptoms when she is off of her feet with legs elevated but her symptoms are never completely relieved. She also has chronic back discomfort. She hasn't time been told she had lymphedema of her legs and other times been told that this is not lymphedema.  Past Medical History:  Diagnosis Date  . Allergy   . Arthritis   . Asthma   . Blood transfusion without reported diagnosis    with cancer surgery  . Cancer ([place])    breast and skin  . Diabetes mellitus without complication ([place])   . Hyperlipidemia   . Hypertension   . Lymphedema of leg   . Thyroid disease   . Varicose veins of bilateral lower extremities with other complications     Social History  Substance Use Topics  . Smoking status: Never Smoker  . Smokeless tobacco: Never Used  . Alcohol use No    Family History  Problem Relation Age of Onset  . Heart disease Mother 90    heart attack  . COPD Father   . Cancer Father   . Celiac disease Daughter   . Cancer Maternal Aunt     lung-smoker    Allergies  Allergen Reactions  . Penicillins Anaphylaxis    Childhood allergy-unknown  . Sulfa Antibiotics Swelling    angioedema  . Demerol [Meperidine] Other (See Comments)    anxiety  . Morphine And Related Itching    side effect      Current Outpatient Prescriptions:  .   atenolol (TENORMIN) 50 MG tablet, Take 1 tablet (50 mg total) by mouth daily., Disp: 90 tablet, Rfl: 3 .  cholecalciferol (VITAMIN D) 1000 units tablet, Take 1,000 Units by mouth daily., Disp: , Rfl:  .  gabapentin (NEURONTIN) 300 MG capsule, Take 1 capsule (300 mg total) by mouth 3 (three) times daily., Disp: 90 capsule, Rfl: 1 .  hydrochlorothiazide (HYDRODIURIL) 25 MG tablet, Take 1 tablet (25 mg total) by mouth daily., Disp: 90 tablet, Rfl: 3 .  ibuprofen (ADVIL,MOTRIN) 800 MG tablet, Take 1 tablet (800 mg total) by mouth 3 (three) times daily., Disp: 90 tablet, Rfl: 1 .  levothyroxine (SYNTHROID) 175 MCG tablet, Take 1 tablet (175 mcg total) by mouth daily before breakfast., Disp: 90 tablet, Rfl: 3 .  metroNIDAZOLE (METROGEL) 1 % gel, Apply topically daily., Disp: 45 g, Rfl: 1 .  oxycodone (ROXICODONE) 30 MG immediate release tablet, Take 1 tablet (30 mg total) by mouth every 6 (six) hours as needed for pain. Maximum daily dose 3 tabs, Disp: 90 tablet, Rfl: 0  Vitals:   06/01/16 1345  BP: 126/84  Pulse: 71  Resp: 16  Temp: 97.1 F (36.2 C)  SpO2: 99%  Weight: 214 lb (97.1 kg)  Height: 5\' 4"  (1.626 m)    Body mass index is 36.73 kg/m.  Review of Systems Denies chest pain, dyspnea on exertion, hemoptysis, claudication    Objective:   Physical Exam BP 126/84 (BP Location: Left Arm, Patient Position: Sitting, Cuff Size: Large)   Pulse 71   Temp 97.1 F (36.2 C)   Resp 16   Ht 5\' 4"  (1.626 m)   Wt 214 lb (97.1 kg)   SpO2 99%   BMI 36.73 kg/m     Gen.-alert and oriented x3 in no apparent distress-obese HEENT normal for age Lungs no rhonchi or wheezing Cardiovascular regular rhythm no murmurs carotid pulses 3+ palpable no bruits audible Abdomen soft nontender no palpable masses Musculoskeletal free of  major deformities Skin clear -no rashes Neurologic normal Lower extremities 3+ femoral and dorsalis pedis pulses palpable bilaterally with no edema at ankle  level Abnormal configuration of bilateral lower extremities with extensive localized fat apposition medially in thigh and calf area but no hyperpigmentation or Estel findings consistent with chronic venous insufficiency. Bulging varicosities left leg in the lateral calf and less so on the right side. No ulcerations noted.  The previous ultrasound study performed in October 2017 and [facility] revealed there was reflux in bilateral great saphenous veins with no DVT  Performed a bedside ultrasound sono site exam which revealed a large caliber left proximal great saphenous vein at saphenofemoral junction and then became warm normal in size in the upper third of the leg and small at the distal thigh level on the right side there was some enlargement of the great saphenous vein near the saphenofemoral junction which likewise was much smaller in the distal thigh       Assessment:     Bilateral leg pain-etiology unknown Stable chronic varicose veins left worse than right with some proximal great saphenous reflux bilaterally Chronic obesity    Plan:     Do not think treatment of patient's venous reflux in the great saphenous system in the proximal third of both legs which changed her symptomatology at all Removing her varicose veins would also not change her symptomatology of bilateral leg pain Recommendation is no specific treatments related to her venous disease which is chronic Legs are not configured such that elastic compression stockings are on option

## 2016-06-10 ENCOUNTER — Ambulatory Visit: Payer: Medicare Other | Admitting: Nutrition

## 2016-06-15 ENCOUNTER — Other Ambulatory Visit: Payer: Self-pay | Admitting: Family Medicine

## 2016-06-15 ENCOUNTER — Telehealth: Payer: Self-pay | Admitting: Family Medicine

## 2016-06-15 DIAGNOSIS — G894 Chronic pain syndrome: Secondary | ICD-10-CM

## 2016-06-15 MED ORDER — OXYCODONE HCL 30 MG PO TABS: 30.0000 mg | ORAL_TABLET | Freq: Four times a day (QID) | ORAL | 0 refills | Status: DC | PRN

## 2016-06-15 NOTE — Telephone Encounter (Signed)
done

## 2016-06-15 NOTE — Telephone Encounter (Signed)
Completed, reviewed and signed script

## 2016-06-15 NOTE — Telephone Encounter (Signed)
Please sign to cover for dr Meda Coffee

## 2016-06-15 NOTE — Telephone Encounter (Signed)
Patient is asking for her Rx for ooxycodone (ROXICODONE) 30 MG immediate release tablet to be picked up, please advise?f

## 2016-06-15 NOTE — Telephone Encounter (Signed)
pls print her medication history so I can review, since last  rx  was11/13 I would have expected a refil;l request last week probably

## 2016-06-16 ENCOUNTER — Encounter: Payer: Medicare Other | Admitting: Vascular Surgery

## 2016-07-01 ENCOUNTER — Ambulatory Visit: Payer: Medicare Other | Admitting: Nutrition

## 2016-07-03 NOTE — Telephone Encounter (Signed)
Seen 10 18 17

## 2016-07-14 ENCOUNTER — Ambulatory Visit: Payer: Medicare Other | Admitting: Nutrition

## 2016-07-15 ENCOUNTER — Ambulatory Visit: Payer: Medicare Other | Admitting: Nutrition

## 2016-07-16 ENCOUNTER — Ambulatory Visit: Payer: Medicare Other | Admitting: Family Medicine

## 2016-07-16 ENCOUNTER — Ambulatory Visit: Payer: Medicare Other | Admitting: Nutrition

## 2016-07-17 ENCOUNTER — Ambulatory Visit (INDEPENDENT_AMBULATORY_CARE_PROVIDER_SITE_OTHER): Payer: Medicare Other | Admitting: Family Medicine

## 2016-07-17 ENCOUNTER — Encounter: Payer: Self-pay | Admitting: Family Medicine

## 2016-07-17 ENCOUNTER — Telehealth: Payer: Self-pay

## 2016-07-17 VITALS — BP 128/80 | HR 56 | Temp 98.1°F | Resp 18 | Ht 64.0 in

## 2016-07-17 DIAGNOSIS — L719 Rosacea, unspecified: Secondary | ICD-10-CM

## 2016-07-17 DIAGNOSIS — H9191 Unspecified hearing loss, right ear: Secondary | ICD-10-CM

## 2016-07-17 DIAGNOSIS — G894 Chronic pain syndrome: Secondary | ICD-10-CM | POA: Diagnosis not present

## 2016-07-17 DIAGNOSIS — L853 Xerosis cutis: Secondary | ICD-10-CM

## 2016-07-17 MED ORDER — AZELAIC ACID 15 % EX GEL: CUTANEOUS | 3 refills | Status: DC

## 2016-07-17 MED ORDER — OXYCODONE HCL 30 MG PO TABS: 30.0000 mg | ORAL_TABLET | Freq: Four times a day (QID) | ORAL | 0 refills | Status: DC | PRN

## 2016-07-17 NOTE — Patient Instructions (Signed)
Take the gabapentin at bedtime After a week or two, then can take twice a day morning and evening May increase to three times a day  You MAY be able to reduce the oxycodone if this helps  Try the new cream twice a day on your face rash Let me know if this works  See me in 2-3 months

## 2016-07-17 NOTE — Progress Notes (Signed)
Chief Complaint  Patient presents with  . Follow-up    3 month   Here for refill pain medicine Has not yet started gabapentin This is discussed and a plan instituted for gradual ramping up of dose to hope to reduce opiates  Was unable to afford metronidazole gel for face so alternate is prescribed  Had dry skin of back and lotions not helping, worried about  Skin infection Lesion on R shin examined - appears benign Patient Active Problem List   Diagnosis Date Noted  . Varicose veins of bilateral lower extremities with other complications 123456  . Essential hypertension 03/16/2016  . Lymphedema 03/16/2016  . Breast cancer in female [facility]) 03/16/2016  . Hypothyroidism 03/16/2016  . 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 Prediabetes 03/16/2016  . Obesity 03/16/2016  . Hyperlipidemia 03/16/2016  . Chronic pain syndrome 03/16/2016    Outpatient Encounter Prescriptions as of 07/17/2016  Medication Sig  . atenolol (TENORMIN) 50 MG tablet Take 1 tablet (50 mg total) by mouth daily.  . cholecalciferol (VITAMIN D) 1000 units tablet Take 1,000 Units by mouth daily.  . hydrochlorothiazide (HYDRODIURIL) 25 MG tablet Take 1 tablet (25 mg total) by mouth daily.  Marland Kitchen ibuprofen (ADVIL,MOTRIN) 800 MG tablet TAKE (1) TABLET BY MOUTH THREE TIMES DAILY AS NEEDED.  Marland Kitchen levothyroxine (SYNTHROID) 175 MCG tablet Take 1 tablet (175 mcg total) by mouth daily before breakfast.  . [START ON 08/17/2016] oxycodone (ROXICODONE) 30 MG immediate release tablet Take 1 tablet (30 mg total) by mouth every 6 (six) hours as needed for pain. Maximum daily dose 3 tabs  . Azelaic Acid 15 % cream After skin is thoroughly washed and patted dry, massage a thin film of azelaic acid cream into the affected area twice daily  . gabapentin (NEURONTIN) 300 MG capsule Take 1 capsule (300 mg total) by mouth 3 (three) times daily. (Patient not taking:  Reported on 07/17/2016)   No facility-administered encounter medications on file as of 07/17/2016.     Allergies  Allergen Reactions  . Penicillins Anaphylaxis    Childhood allergy-unknown  . Sulfa Antibiotics Swelling    angioedema  . Demerol [Meperidine] Other (See Comments)    anxiety  . Morphine And Related Itching    side effect     Review of Systems  Constitutional: Negative for activity change, appetite change, fatigue and unexpected weight change.  HENT: Positive for ear pain and hearing loss. Negative for congestion, postnasal drip and rhinorrhea.   Eyes: Negative.  Negative for visual disturbance.  Respiratory: Negative for cough and shortness of breath.   Cardiovascular: Negative for chest pain, palpitations and leg swelling.  Gastrointestinal: Negative for constipation and diarrhea.  Genitourinary: Negative.  Negative for difficulty urinating.  Musculoskeletal: Positive for arthralgias, back pain and gait problem.       Leg pain.  Bone pain   Skin: Positive for rash.       Cheeks and chin  Neurological: Negative for weakness and numbness.  Psychiatric/Behavioral: Negative for dysphoric mood. The patient is not nervous/anxious.     BP 128/80 (BP Location: Left Arm, Patient Position: Sitting, Cuff Size: Large)   Pulse (!) 56   Temp 98.1 F (36.7 C) (Oral)   Resp 18   Ht 5\' 4"  (1.626 m)   SpO2 96%   Physical Exam  Constitutional: She is oriented to person, place, and time. She appears well-developed and well-nourished. No distress.  Morbidly obese. Appears tired  HENT:  Head: Normocephalic and atraumatic.  Right Ear: External ear normal.  Left Ear: External ear normal.  Mouth/Throat: Oropharynx is clear and moist.  No wax.  Right TM has fluid line  Eyes: Conjunctivae are normal. Pupils are equal, round, and reactive to light.  Neck: Normal range of motion. No thyromegaly present.  Cardiovascular: Normal rate, regular rhythm and normal heart sounds.     Systolic murmur at RUSB  Pulmonary/Chest: Effort normal and breath sounds normal.  Musculoskeletal: Normal range of motion. She exhibits no edema.  She does have bilateral significant venous varicosities visible. She does have adipose deposits in her legs that she refers to as "lymphedema" was quite narrow ankles and no pitting edema visible.  Lymphadenopathy:    She has no cervical adenopathy.  Neurological: She is alert and oriented to person, place, and time.  Skin: Skin is warm and dry.  Mild flush to cheeks with few acne lesions, telangiectasias nose and forehead  Psychiatric: She has a normal mood and affect. Her behavior is normal. Thought content normal.    ASSESSMENT/PLAN:  1. Chronic pain syndrome  - oxycodone (ROXICODONE) 30 MG immediate release tablet; Take 1 tablet (30 mg total) by mouth every 6 (six) hours as needed for pain. Maximum daily dose 3 tabs  Dispense: 90 tablet; Refill: 0  2. Rosacea   3. Decreased hearing of right ear   4. Dry skin dermatitis    Patient Instructions  Take the gabapentin at bedtime After a week or two, then can take twice a day morning and evening May increase to three times a day  You MAY be able to reduce the oxycodone if this helps  Try the new cream twice a day on your face rash Let me know if this works  See me in 2-3 months   Raylene Everts, MD

## 2016-07-17 NOTE — Telephone Encounter (Signed)
error 

## 2016-07-22 ENCOUNTER — Ambulatory Visit: Payer: Medicare Other | Admitting: Nutrition

## 2016-07-23 ENCOUNTER — Encounter: Payer: Self-pay | Admitting: Family Medicine

## 2016-07-23 ENCOUNTER — Ambulatory Visit (INDEPENDENT_AMBULATORY_CARE_PROVIDER_SITE_OTHER): Payer: Medicare Other | Admitting: Family Medicine

## 2016-07-23 VITALS — BP 130/78 | HR 54 | Temp 97.7°F | Resp 18 | Ht 64.0 in

## 2016-07-23 DIAGNOSIS — M25512 Pain in left shoulder: Secondary | ICD-10-CM

## 2016-07-23 DIAGNOSIS — H00011 Hordeolum externum right upper eyelid: Secondary | ICD-10-CM | POA: Diagnosis not present

## 2016-07-23 DIAGNOSIS — M7541 Impingement syndrome of right shoulder: Secondary | ICD-10-CM

## 2016-07-23 MED ORDER — CLINDAMYCIN HCL 150 MG PO CAPS: 150.0000 mg | ORAL_CAPSULE | Freq: Three times a day (TID) | ORAL | 0 refills | Status: DC

## 2016-07-23 MED ORDER — METHYLPREDNISOLONE ACETATE 80 MG/ML IJ SUSP
80.0000 mg | Freq: Once | INTRAMUSCULAR | Status: AC
  Administered 2016-07-23: 80 mg via INTRA_ARTICULAR

## 2016-07-23 NOTE — Patient Instructions (Signed)
Warm compresses to eye Antibiotic 3 times a day Take 2 doses today  Ice to shoulder Gently range of motion  Call for problem

## 2016-07-23 NOTE — Progress Notes (Signed)
Chief Complaint  Patient presents with  . Eye Problem    right  acute visit 2 problems 1. Right eye with swelling of upper lid, pain, redness x 2 days.  No drainage or discharge.  No problem with vision.  No trauma.  Has been using a warm compress. 2. Left shoulder pain.  Pain with lifting and reaching.  Trouble getting into clothes. No injury.  Known arthritis.  No numbness or weakness.   Patient Active Problem List   Diagnosis Date Noted  . Varicose veins of bilateral lower extremities with other complications 123456  . Essential hypertension 03/16/2016  . Lymphedema 03/16/2016  . Breast cancer in female [facility]) 03/16/2016  . Hypothyroidism 03/16/2016  . 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 Prediabetes 03/16/2016  . Obesity 03/16/2016  . Hyperlipidemia 03/16/2016  . Chronic pain syndrome 03/16/2016    Outpatient Encounter Prescriptions as of 07/23/2016  Medication Sig  . atenolol (TENORMIN) 50 MG tablet Take 1 tablet (50 mg total) by mouth daily.  . Azelaic Acid 15 % cream After skin is thoroughly washed and patted dry, massage a thin film of azelaic acid cream into the affected area twice daily  . cholecalciferol (VITAMIN D) 1000 units tablet Take 1,000 Units by mouth daily.  . hydrochlorothiazide (HYDRODIURIL) 25 MG tablet Take 1 tablet (25 mg total) by mouth daily.  Marland Kitchen ibuprofen (ADVIL,MOTRIN) 800 MG tablet TAKE (1) TABLET BY MOUTH THREE TIMES DAILY AS NEEDED.  Marland Kitchen levothyroxine (SYNTHROID) 175 MCG tablet Take 1 tablet (175 mcg total) by mouth daily before breakfast.  . metroNIDAZOLE (METROGEL) 1 % gel Apply topically daily.  Derrill Memo ON 08/17/2016] oxycodone (ROXICODONE) 30 MG immediate release tablet Take 1 tablet (30 mg total) by mouth every 6 (six) hours as needed for pain. Maximum daily dose 3 tabs  . clindamycin (CLEOCIN) 150 MG capsule Take 1 capsule (150 mg total) by mouth 3 (three) times  daily.  Marland Kitchen gabapentin (NEURONTIN) 300 MG capsule Take 1 capsule (300 mg total) by mouth 3 (three) times daily. (Patient not taking: Reported on 07/23/2016)   No facility-administered encounter medications on file as of 07/23/2016.     Allergies  Allergen Reactions  . Penicillins Anaphylaxis    Childhood allergy-unknown  . Sulfa Antibiotics Swelling    angioedema  . Demerol [Meperidine] Other (See Comments)    anxiety  . Morphine And Related Itching    side effect     Review of Systems  Constitutional: Negative for activity change, appetite change, fatigue and unexpected weight change.  HENT: Negative for congestion, postnasal drip and rhinorrhea.   Eyes: Positive for pain and redness. Negative for discharge and visual disturbance.  Respiratory: Negative for cough and shortness of breath.   Cardiovascular: Negative for chest pain, palpitations and leg swelling.  Gastrointestinal: Negative for constipation and diarrhea.  Genitourinary: Negative.  Negative for difficulty urinating.  Musculoskeletal: Positive for arthralgias, back pain and gait problem.       Leg pain.  Bone pain- chronic.  New L shoulder pain  Skin: Positive for rash.       Cheeks and chin  Neurological: Negative for weakness and numbness.  Psychiatric/Behavioral: Negative for dysphoric mood. The patient is not nervous/anxious.     BP 130/78 (BP Location: Left Arm, Patient Position: Sitting, Cuff Size: Normal)   Pulse (!) 54   Temp 97.7 F (36.5 C) (Oral)   Resp 18  Ht 5\' 4"  (1.626 m)   SpO2 97%   Physical Exam  Constitutional: She is oriented to person, place, and time. She appears well-developed and well-nourished. No distress.  HENT:  Head: Normocephalic and atraumatic.  Right Ear: External ear normal.  Left Ear: External ear normal.  Mouth/Throat: Oropharynx is clear and moist.  Eyes: Conjunctivae are normal. Pupils are equal, round, and reactive to light. Right eye exhibits hordeolum.    Neck:  Normal range of motion. Neck supple.  Cardiovascular: Normal rate, regular rhythm and normal heart sounds.   Pulmonary/Chest: Effort normal and breath sounds normal.  Musculoskeletal: She exhibits no edema.  Left shoulder with limited abduction and rotation ext>int.  Positive empty can test.  Lymphadenopathy:    She has no cervical adenopathy.  Neurological: She is alert and oriented to person, place, and time.  Psychiatric: She has a normal mood and affect. Her behavior is normal. Thought content normal.   Time out Consent Posterior ( LEFT ) shoulder prepped and marked 1cc of 1% lidocaine wheal placed at injection site 3 cc of 1% lidocaine with 40 mg of DepoMedrol placed into subacromial space Patient tolerated procedure well Post injection instructions reviewed.  ASSESSMENT/PLAN:  1. Hordeolum externum of right upper eyelid With lid swelling I am treating with oral clindamycin. PCN and sulfa allergy  2. Impingement syndrome of left shoulder  - methylPREDNISolone acetate (DEPO-MEDROL) injection 80 mg; Inject 1 mL (80 mg total) into the articular space once.  3. Acute pain of left shoulder  - methylPREDNISolone acetate (DEPO-MEDROL) injection 80 mg; Inject 1 mL (80 mg total) into the articular space once.   Patient Instructions  Warm compresses to eye Antibiotic 3 times a day Take 2 doses today  Ice to shoulder Gently range of motion  Call for problem   Raylene Everts, MD

## 2016-07-28 ENCOUNTER — Encounter: Payer: Medicare Other | Attending: Family Medicine | Admitting: Nutrition

## 2016-07-28 DIAGNOSIS — E782 Mixed hyperlipidemia: Secondary | ICD-10-CM

## 2016-07-28 DIAGNOSIS — R739 Hyperglycemia, unspecified: Secondary | ICD-10-CM

## 2016-07-28 NOTE — Patient Instructions (Addendum)
Goals 1 Try to stick to meal schedule of B 6-8 am Lunch 12-2 pm and dinner 5-7 pm. 2. Cut out snacks between meals 3. Keep drinking water 4. Look into the [facility] for water aerobics 5. Work on Engineer, manufacturing eating. Lose 10 lbs by next visit. Work on meal time discipline and don't eat for emotional reasons.

## 2016-07-28 NOTE — Progress Notes (Signed)
  Medical Nutrition Therapy:  Appt start time: 1630 end time:  1430.  Assessment:  Primary concerns today  Prediabetes, Obesity and Hyperlipidemia. She reports she can't get use to eat three balanced meals per day. She notes it's too much food and she prefers to eat small amounts of food but frequently. Admits to emotional eating often. States she is chronically tired and in chronic pain.    Lost 2 lbs. Drinks water.  Will see Dr. Meda Coffee in early March 2018 for more lab work. Hasn't been following a Gluten free diet. Eats whatever she wants but trying to eat more vegetables.    Limited commitment to change eating habits to allow for needed weight loss and improved blood sugar control. Needs higher fiber diet.  Biggest issue is emotional eating, not due to real hunger.Not physically active but says she wants to go to [facility] and see about water aerobics.  Preferred Learning Style:   Visual  Learning Readiness:   Ready  Change in progress   MEDICATIONS: see list   DIETARY INTAKE   24-hr recall:  B ( AM):Mashed potatoes, meat and gravy, Snk ( AM): none  L ( PM): Spinach salad with eggs, raisins, nuts Snk ( PM):  D ( PM):  Usually meat and some sides.  Snk ( PM): Beverages: water  Usual physical activity: ADL  Estimated energy needs: 1500 calories 170 g carbohydrates 112 g protein 42 g fat  Progress Towards Goal(s):  In progress.   Nutritional Diagnosis:  NB-1.1 Food and nutrition-related knowledge deficit As related to Obesity and prediabetes.  As evidenced by BMI> 40 and A1C 5.8%    Intervention:  Nutrition and Diabetes education provided on My Plate, CHO counting, meal planning, portion sizes, timing of meals, avoiding snacks between meals unless having a low blood sugar, target ranges for A1C and blood sugars, signs/symptoms and treatment of hyper/hypoglycemia,  benefits of exercising 30 minutes per day and prevention of complications of DM. Low Salt Hearty Healthy Gluten Free  Diet  Goals 1 Try to stick to meal schedule of B 6-8 am Lunch 12-2 pm and dinner 5-7 pm. 2. Cut out snacks between meals 3. Keep drinking water 4. Look into the [facility] for water aerobics 5. Work on Engineer, manufacturing eating. Lose 10 lbs by next visit. Work on meal time discipline and don't eat for emotional reasons.  Teaching Method Utilized:  Visual Auditory Hands on  Handouts given during visit include:  The Plate Method   Meal Plan Card  Barriers to learning/adherence to lifestyle change: none  Demonstrated degree of understanding via:  Teach Back   Monitoring/Evaluation:  Dietary intake, exercise, meal planning, and body weight in 6-8 weeks. (s).

## 2016-08-04 ENCOUNTER — Ambulatory Visit: Payer: Medicare Other | Admitting: Nutrition

## 2016-09-08 ENCOUNTER — Encounter: Payer: Self-pay | Admitting: Family Medicine

## 2016-09-08 ENCOUNTER — Ambulatory Visit (INDEPENDENT_AMBULATORY_CARE_PROVIDER_SITE_OTHER): Payer: Medicare Other | Admitting: Family Medicine

## 2016-09-08 VITALS — BP 138/88 | HR 68 | Temp 98.1°F | Resp 18 | Ht 64.0 in | Wt 212.0 lb

## 2016-09-08 DIAGNOSIS — M7541 Impingement syndrome of right shoulder: Secondary | ICD-10-CM | POA: Diagnosis not present

## 2016-09-08 DIAGNOSIS — G894 Chronic pain syndrome: Secondary | ICD-10-CM | POA: Diagnosis not present

## 2016-09-08 DIAGNOSIS — I83813 Varicose veins of bilateral lower extremities with pain: Secondary | ICD-10-CM

## 2016-09-08 DIAGNOSIS — M25512 Pain in left shoulder: Secondary | ICD-10-CM

## 2016-09-08 MED ORDER — NYSTATIN 100000 UNIT/GM EX POWD: Freq: Four times a day (QID) | CUTANEOUS | 1 refills | Status: DC

## 2016-09-08 MED ORDER — HYDROCHLOROTHIAZIDE 25 MG PO TABS: 25.0000 mg | ORAL_TABLET | Freq: Every day | ORAL | 3 refills | Status: DC

## 2016-09-08 MED ORDER — ATENOLOL 50 MG PO TABS: 50.0000 mg | ORAL_TABLET | Freq: Every day | ORAL | 3 refills | Status: DC

## 2016-09-08 MED ORDER — OXYCODONE HCL 30 MG PO TABS: 30.0000 mg | ORAL_TABLET | Freq: Four times a day (QID) | ORAL | 0 refills | Status: DC | PRN

## 2016-09-08 MED ORDER — LEVOTHYROXINE SODIUM 175 MCG PO TABS: 175.0000 ug | ORAL_TABLET | Freq: Every day | ORAL | 3 refills | Status: DC

## 2016-09-08 NOTE — Patient Instructions (Addendum)
I am placing a referral to a pain specialty clinic Take the gabapentin at night  Use the nystatin powder to prevent rash   Continue current medicines See me every 3 months

## 2016-09-08 NOTE — Progress Notes (Signed)
Chief Complaint  Patient presents with  . Shoulder Pain    left   Unremitting pain Not controlled by oxycodone 30 TID We have discussed that I am NOT comfortable with the high dose of narcotic she has been titrated to I have recommended a trial of taper She wishes to go on something else "like methadone or a pain patch" She has STILL not yet taken the gabapentin prescribed She is still reluctant to go on a SSRI She did not get good relief from the cortisone shot L subacromial space. Has malodorous rash under breasts  Patient Active Problem List   Diagnosis Date Noted  . Varicose veins of bilateral lower extremities with other complications 61/44/3154  . Essential hypertension 03/16/2016  . Lymphedema 03/16/2016  . Breast cancer in female [facility]) 03/16/2016  . Hypothyroidism 03/16/2016  . 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 Prediabetes 03/16/2016  . Obesity 03/16/2016  . Hyperlipidemia 03/16/2016  . Chronic pain syndrome 03/16/2016    Outpatient Encounter Prescriptions as of 09/08/2016  Medication Sig  . atenolol (TENORMIN) 50 MG tablet Take 1 tablet (50 mg total) by mouth daily.  . cholecalciferol (VITAMIN D) 1000 units tablet Take 1,000 Units by mouth daily.  . clindamycin (CLEOCIN) 150 MG capsule Take 1 capsule (150 mg total) by mouth 3 (three) times daily.  Marland Kitchen gabapentin (NEURONTIN) 300 MG capsule Take 1 capsule (300 mg total) by mouth 3 (three) times daily.  . hydrochlorothiazide (HYDRODIURIL) 25 MG tablet Take 1 tablet (25 mg total) by mouth daily.  Marland Kitchen ibuprofen (ADVIL,MOTRIN) 800 MG tablet TAKE (1) TABLET BY MOUTH THREE TIMES DAILY AS NEEDED.  Marland Kitchen levothyroxine (SYNTHROID) 175 MCG tablet Take 1 tablet (175 mcg total) by mouth daily before breakfast.  . metroNIDAZOLE (METROGEL) 1 % gel Apply topically daily.  Marland Kitchen oxycodone (ROXICODONE) 30 MG immediate release tablet Take 1 tablet (30 mg total) by  mouth every 6 (six) hours as needed for pain. Maximum daily dose 3 tabs  . nystatin (MYCOSTATIN/NYSTOP) powder Apply topically 4 (four) times daily.   No facility-administered encounter medications on file as of 09/08/2016.     Allergies  Allergen Reactions  . Penicillins Anaphylaxis    Childhood allergy-unknown  . Sulfa Antibiotics Swelling    angioedema  . Demerol [Meperidine] Other (See Comments)    anxiety  . Morphine And Related Itching    side effect     Review of Systems  Constitutional: Positive for unexpected weight change.  HENT: Negative for congestion and dental problem.   Eyes: Negative for photophobia and visual disturbance.  Respiratory: Negative for cough.   Cardiovascular: Positive for leg swelling. Negative for chest pain.  Gastrointestinal: Negative for constipation and diarrhea.  Musculoskeletal: Positive for arthralgias, back pain, gait problem and neck pain.  Skin: Positive for rash.  Psychiatric/Behavioral: Positive for dysphoric mood and sleep disturbance.    BP 138/88 (BP Location: Right Arm, Patient Position: Sitting, Cuff Size: Normal)   Pulse 68   Temp 98.1 F (36.7 C) (Temporal)   Resp 18   Ht 5\' 4"  (1.626 m)   Wt 212 lb 0.6 oz (96.2 kg)   SpO2 98%   BMI 36.40 kg/m   Physical Exam  Constitutional: She is oriented to person, place, and time. She appears well-developed and well-nourished. No distress.  Morbidly obese. Appears tired  HENT:  Head: Normocephalic and atraumatic.  Mouth/Throat: Oropharynx is clear and moist.  Eyes:  Conjunctivae are normal. Pupils are equal, round, and reactive to light.  Neck: Normal range of motion. No thyromegaly present.  Cardiovascular: Normal rate, regular rhythm and normal heart sounds.   Systolic murmur at RUSB  Pulmonary/Chest: Effort normal and breath sounds normal.  Musculoskeletal: Normal range of motion. She exhibits no edema.  She does have bilateral significant venous varicosities visible. She  does have adipose deposits in her legs ,, no pitting edema visible. Left shoulder with limited motion and palpable tender AC prominence  Lymphadenopathy:    She has no cervical adenopathy.  Neurological: She is alert and oriented to person, place, and time.  Skin: Skin is warm and dry.  Mild flush to cheeks , erythema under breasts  Psychiatric: She has a normal mood and affect. Her behavior is normal. Thought content normal.  discouraged    ASSESSMENT/PLAN:  1. Chronic pain syndrome  - Ambulatory referral to Pain Clinic - oxycodone (ROXICODONE) 30 MG immediate release tablet; Take 1 tablet (30 mg total) by mouth every 6 (six) hours as needed for pain. Maximum daily dose 3 tabs  Dispense: 90 tablet; Refill: 0  2. Varicose veins of both lower extremities with pain   3. Impingement syndrome of left shoulder   4. Pain of left shoulder- chronic    Patient Instructions  I am placing a referral to a pain specialty clinic Take the gabapentin at night  Use the nystatin powder to prevent rash   Continue current medicines See me every 3 months   Raylene Everts, MD

## 2016-09-10 ENCOUNTER — Encounter: Payer: Self-pay | Admitting: Family Medicine

## 2016-09-10 ENCOUNTER — Ambulatory Visit (INDEPENDENT_AMBULATORY_CARE_PROVIDER_SITE_OTHER): Payer: Medicare Other | Admitting: Family Medicine

## 2016-09-10 VITALS — BP 136/84 | HR 60 | Temp 98.0°F | Resp 18 | Ht 64.0 in | Wt 212.0 lb

## 2016-09-10 DIAGNOSIS — S31109A Unspecified open wound of abdominal wall, unspecified quadrant without penetration into peritoneal cavity, initial encounter: Secondary | ICD-10-CM | POA: Diagnosis not present

## 2016-09-10 MED ORDER — CLINDAMYCIN HCL 300 MG PO CAPS: 300.0000 mg | ORAL_CAPSULE | Freq: Three times a day (TID) | ORAL | 0 refills | Status: DC

## 2016-09-10 NOTE — Patient Instructions (Signed)
Take antibiotic for 5 days May use antibiotic ointment We will call with culture result

## 2016-09-10 NOTE — Progress Notes (Signed)
    Chief Complaint  Patient presents with  . Wound Check   Found a wound on lower abdomen- uncertain cause Cannot see area well due to obesity Area is tender, red and swollen No fever or malaise  Patient Active Problem List   Diagnosis Date Noted  . Varicose veins of bilateral lower extremities with other complications 42/68/3419  . Essential hypertension 03/16/2016  . Lymphedema 03/16/2016  . Breast cancer in female [facility]) 03/16/2016  . Hypothyroidism 03/16/2016  . 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 Prediabetes 03/16/2016  . Obesity 03/16/2016  . Hyperlipidemia 03/16/2016  . Chronic pain syndrome 03/16/2016    Outpatient Encounter Prescriptions as of 09/10/2016  Medication Sig  . atenolol (TENORMIN) 50 MG tablet Take 1 tablet (50 mg total) by mouth daily.  . cholecalciferol (VITAMIN D) 1000 units tablet Take 1,000 Units by mouth daily.  Marland Kitchen gabapentin (NEURONTIN) 300 MG capsule Take 1 capsule (300 mg total) by mouth 3 (three) times daily.  . hydrochlorothiazide (HYDRODIURIL) 25 MG tablet Take 1 tablet (25 mg total) by mouth daily.  Marland Kitchen ibuprofen (ADVIL,MOTRIN) 800 MG tablet TAKE (1) TABLET BY MOUTH THREE TIMES DAILY AS NEEDED.  Marland Kitchen levothyroxine (SYNTHROID) 175 MCG tablet Take 1 tablet (175 mcg total) by mouth daily before breakfast.  . metroNIDAZOLE (METROGEL) 1 % gel Apply topically daily.  Marland Kitchen nystatin (MYCOSTATIN/NYSTOP) powder Apply topically 4 (four) times daily.  Marland Kitchen oxycodone (ROXICODONE) 30 MG immediate release tablet Take 1 tablet (30 mg total) by mouth every 6 (six) hours as needed for pain. Maximum daily dose 3 tabs  . clindamycin (CLEOCIN) 300 MG capsule Take 1 capsule (300 mg total) by mouth 3 (three) times daily.   No facility-administered encounter medications on file as of 09/10/2016.     Allergies  Allergen Reactions  . Penicillins Anaphylaxis    Childhood allergy-unknown  . Sulfa  Antibiotics Swelling    angioedema  . Demerol [Meperidine] Other (See Comments)    anxiety  . Morphine And Related Itching    side effect     Review of Systems  Constitutional: Negative for activity change, appetite change, chills and fever.  Musculoskeletal: Positive for arthralgias, back pain and gait problem.       Chronic pain  Skin: Positive for color change and wound.  Psychiatric/Behavioral: Positive for sleep disturbance.  All other systems reviewed and are negative.   BP 136/84 (BP Location: Right Arm, Patient Position: Sitting, Cuff Size: Large)   Pulse 60   Temp 98 F (36.7 C) (Temporal)   Resp 18   Ht 5\' 4"  (1.626 m)   Wt 212 lb (96.2 kg)   SpO2 97%   BMI 36.39 kg/m   Physical Exam  Constitutional: She is oriented to person, place, and time. She appears well-developed and well-nourished. No distress.  HENT:  Head: Normocephalic and atraumatic.  Mouth/Throat: Oropharynx is clear and moist.  Abdominal:    Neurological: She is alert and oriented to person, place, and time.  Psychiatric: She has a normal mood and affect. Her behavior is normal.    ASSESSMENT/PLAN:  1. Wound of abdomen Possible spider bite?   - Wound culture Rx Clindamycin X 5 d  Patient Instructions  Take antibiotic for 5 days May use antibiotic ointment We will call with culture result   Raylene Everts, MD

## 2016-09-13 LAB — WOUND CULTURE: GRAM STAIN: NONE SEEN

## 2016-09-14 ENCOUNTER — Telehealth: Payer: Self-pay

## 2016-09-14 NOTE — Telephone Encounter (Signed)
-----   Message from Raylene Everts, MD sent at 09/14/2016  8:49 AM EDT ----- Please call Ms Schreffler ans see how her wound is doing.

## 2016-09-14 NOTE — Telephone Encounter (Signed)
lmtcb

## 2016-09-15 ENCOUNTER — Telehealth: Payer: Self-pay

## 2016-09-15 NOTE — Telephone Encounter (Signed)
-----   Message from Raylene Everts, MD sent at 09/14/2016  8:49 AM EDT ----- Please call Teresa Anderson ans see how her wound is doing.

## 2016-09-15 NOTE — Telephone Encounter (Signed)
Left message for pt to call office

## 2016-09-15 NOTE — Telephone Encounter (Signed)
-----   Message from Raylene Everts, MD sent at 09/14/2016  8:49 AM EDT ----- Please call Ms Kerce ans see how her wound is doing.

## 2016-09-15 NOTE — Telephone Encounter (Signed)
Called and spoke to pt, states the wound on her abd is a little, but is now scabbed over.she has completed her abx.

## 2016-09-17 ENCOUNTER — Other Ambulatory Visit: Payer: Self-pay

## 2016-09-17 ENCOUNTER — Ambulatory Visit: Payer: Medicare Other | Admitting: Family Medicine

## 2016-09-17 ENCOUNTER — Ambulatory Visit: Payer: Medicare Other

## 2016-09-17 DIAGNOSIS — G8929 Other chronic pain: Secondary | ICD-10-CM

## 2016-09-17 DIAGNOSIS — M25512 Pain in left shoulder: Principal | ICD-10-CM

## 2016-09-17 MED ORDER — FLUCONAZOLE 150 MG PO TABS: 150.0000 mg | ORAL_TABLET | Freq: Once | ORAL | 1 refills | Status: AC

## 2016-09-23 ENCOUNTER — Ambulatory Visit: Payer: Medicare Other | Admitting: Nutrition

## 2016-10-08 ENCOUNTER — Ambulatory Visit (INDEPENDENT_AMBULATORY_CARE_PROVIDER_SITE_OTHER): Payer: Medicare Other | Admitting: Orthopaedic Surgery

## 2016-10-08 ENCOUNTER — Encounter (INDEPENDENT_AMBULATORY_CARE_PROVIDER_SITE_OTHER): Payer: Self-pay | Admitting: Orthopaedic Surgery

## 2016-10-08 ENCOUNTER — Ambulatory Visit (INDEPENDENT_AMBULATORY_CARE_PROVIDER_SITE_OTHER): Payer: Medicare Other

## 2016-10-08 VITALS — Ht 64.0 in | Wt 200.0 lb

## 2016-10-08 DIAGNOSIS — M25512 Pain in left shoulder: Secondary | ICD-10-CM | POA: Insufficient documentation

## 2016-10-08 DIAGNOSIS — M5442 Lumbago with sciatica, left side: Secondary | ICD-10-CM

## 2016-10-08 DIAGNOSIS — M25551 Pain in right hip: Secondary | ICD-10-CM

## 2016-10-08 DIAGNOSIS — G8929 Other chronic pain: Secondary | ICD-10-CM

## 2016-10-08 DIAGNOSIS — G894 Chronic pain syndrome: Secondary | ICD-10-CM

## 2016-10-08 DIAGNOSIS — M5441 Lumbago with sciatica, right side: Secondary | ICD-10-CM

## 2016-10-09 ENCOUNTER — Telehealth: Payer: Self-pay | Admitting: Family Medicine

## 2016-10-09 NOTE — Telephone Encounter (Signed)
Spoke to pt , is asking for a list of " pain" drs.

## 2016-10-09 NOTE — Telephone Encounter (Signed)
Patient called in this morning to make an appt to see Dr.Nelson, she says she believes her spider bite is still infected, it hurts her to bend over. I advised her to go to the urgent care if she needs to be seen before her appt I made for 4pm Monday (ok per selena)  Also, she wants Dr.Nelson to know that she walked out of her appt with Dr.Yates because she felt insulted, states he basically made her feel like a criminal.   She says she thinks her gabapentin may be to strong, she feels "stupid" (patients description) and uneasy. She cant take less because it is a capsule.   Best call back #: for patient is [phone]

## 2016-10-09 NOTE — Progress Notes (Signed)
Office Visit Note   Patient: Teresa Anderson           Date of Birth: 1941/05/12           MRN: [id number] Visit Date: 10/08/2016              Requested by: Teresa Everts, MD [phone], [place] [address] PCP: Teresa Everts, MD   Assessment & Plan: Visit Diagnoses:  1. Chronic bilateral low back pain with bilateral sciatica   2. Acute pain of left shoulder   3. Right hip pain   4. Chronic pain syndrome     Plan: I discussed with her options for workup including MRI of her shoulder. She is gradually ramped up and is now taking the equivalent of 18 Percocets per day total (90 mg of oxycodone daily). I discussed with her that if any procedure could help with her pain this would make pain control difficult. She stated that she only took with the doctors had told her to take. Patient has disc degeneration lumbar spine with anterolisthesis and disc space narrowing and may have some lumbar spinal stenosis. Patient left the office prior to being scheduled for shoulder MRI. 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 If she calls back to the office we can proceed with scheduling. There are no standing open scanners in the state of [place] and she would require sedation prior to an MRI . If she calls  the  office we can proceed with this. She asked about opioid patches i discussed with her that this would not give her the pain relief she is hoping for. I explained to her in detail I would be happy to try to help some of the pain problem she is suffering from and she could call if she would like to have the recommended diagnostic tests.  Follow-Up Instructions: No Follow-up on file.   Orders:  Orders Placed This Encounter  Procedures  . XR Shoulder Left  . XR Lumbar Spine 2-3 Views  . XR HIP UNILAT W OR W/O PELVIS 2-3 VIEWS RIGHT   No orders of the defined types were placed in this encounter.     Procedures: No  procedures performed   Clinical Data: No additional findings.   Subjective: Chief Complaint  Patient presents with  . Lower Back - Pain  . Left Shoulder - Pain    HPI patient is here with severe left shoulder pain and also severe back and leg pain. Patient originally was in 2 different [place] state since been on chronic pain management for at least 5 years. She is here at the request of Teresa Anderson in [place] for evaluation of her pain. Patient is poor historian and she does not recall ever having any workup tests done or any surgery or recommended for her. She states she doesn't want to have surgery. She has decreased range of motion of her left shoulder for several months and states she has severe pain in her back and her legs rated 10 out of 10. She is gradually ramped up on narcotic medication over the years and now takes oxycodone 30 mg IR, 3 times a day getting 90 tablets monthly. She reports that she is too claustrophobic for an MRI and only can have a standup unit. Patient's history of bilateral knee arthroplasties. She complains of pain with standing is only able ambulate short distance can't stand long due to leg weakness. Other  problems include prediabetes, obesity, hypothyroidism. She's had a history of breast cancer, lymphedema without significant swelling in ankles and also hypertension. Patient denies alcohol and tobacco usage. She has no problems with right shoulder range of motion.  Review of Systems  Constitutional: Negative for chills and diaphoresis.       Patient's poor store and she denies fever or chills or swelling in her total knee arthroplasties.  HENT: Negative for ear discharge, ear pain and nosebleeds.   Eyes: Negative for discharge and visual disturbance.  Respiratory: Negative for cough, choking and shortness of breath.   Cardiovascular: Negative for chest pain and palpitations.       Positive for hypertension  Gastrointestinal: Negative for abdominal  distention and abdominal pain.       Positive for increased BMI  Endocrine: Negative for cold intolerance and heat intolerance.  Genitourinary: Negative for flank pain and hematuria.       Positive history of breast cancer.  Musculoskeletal: Positive for back pain.       Positive for pain with standing pain with ambulating distance. She rates her back pain and left shoulder pain is severe. She cannot raise her left arm above shoulder level.  Skin: Negative for rash and wound.  Neurological: Negative for seizures and speech difficulty.       She denies history of stroke.  Hematological: Negative for adenopathy. Does not bruise/bleed easily.  Psychiatric/Behavioral: Negative for agitation and suicidal ideas.     Objective: Vital Signs: Ht 5\' 4"  (1.626 m)   Wt 200 lb (90.7 kg)   BMI 34.33 kg/m   Physical Exam  Constitutional: She is oriented to person, place, and time. She appears well-developed and well-nourished. No distress.  HENT:  Head: Normocephalic and atraumatic.  Right Ear: External ear normal.  Left Ear: External ear normal.  Eyes: Pupils are equal, round, and reactive to light.  Pupils are small  Neck: Normal range of motion. No tracheal deviation present. No thyromegaly present.  Cardiovascular: Normal rate and intact distal pulses.   Pulmonary/Chest: Effort normal.  Abdominal: Soft.  Musculoskeletal:  No brachioplexus tenderness. Left shoulder flexes to 70 with complaints of severe pain. Abduction to 80 she resists and withdraws. Long head of the biceps is moderately tender no distal biceps migration of her reaches full extension. No synovitis of the wrist. She has sciatic notch tenderness negative straight leg raising 90. Superficial varicosities. Some lipoma formation about the knee. Well-healed knee incisions. Intact distal pulses. Anterior tib EHL gastrocsoleus plantar flexion is intact. At 30 shoulder abduction with resisted supraspinatus testing she complains of  severe pain. She can only reach posteriorly to the posterior axillary line with her hand. Internal and external rotation of the humerus with elbow at the side has adequate strength. No tenderness to the distal biceps at the elbow.  Neurological: She is alert and oriented to person, place, and time.  Skin: Skin is warm and dry.  Psychiatric: She has a normal mood and affect. Her behavior is normal.    Ortho Exam no thenar or hyperthenar atrophy ulnar nerve the elbows normal.  Specialty Comments:  No specialty comments available.  Imaging: No results found.   PMFS History: Patient Active Problem List   Diagnosis Date Noted  . Acute pain of left shoulder 10/08/2016  . Varicose veins of bilateral lower extremities with other complications 50/38/8828  . Essential hypertension 03/16/2016  . Lymphedema 03/16/2016  . Breast cancer in female [facility]) 03/16/2016  . Hypothyroidism 03/16/2016  .  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 Prediabetes 03/16/2016  . Obesity 03/16/2016  . Hyperlipidemia 03/16/2016  . Chronic pain syndrome 03/16/2016   Past Medical History:  Diagnosis Date  . Allergy   . Arthritis   . Asthma   . Blood transfusion without reported diagnosis    with cancer surgery  . Cancer ([place])    breast and skin  . Diabetes mellitus without complication ([place])   . Hyperlipidemia   . Hypertension   . Lymphedema of leg   . Thyroid disease   . Varicose veins of bilateral lower extremities with other complications     Family History  Problem Relation Age of Onset  . Heart disease Mother 1    heart attack  . COPD Father   . Cancer Father   . Celiac disease Daughter   . Cancer Maternal Aunt     lung-smoker    Past Surgical History:  Procedure Laterality Date  . ABDOMINAL HYSTERECTOMY    . BREAST SURGERY     bilat  . CESAREAN SECTION     x4  . CHOLECYSTECTOMY    . COSMETIC SURGERY      reconstruction  . MASTECTOMY     bilateral   Social History   Occupational History  . Statistician     retired  . pre school    Social History Main Topics  . Smoking status: Never Smoker  . Smokeless tobacco: Never Used  . Alcohol use No  . Drug use: No  . Sexual activity: Not Currently

## 2016-10-09 NOTE — Telephone Encounter (Signed)
Vesta Mixer.  How does she plan to get her Oxycodone if she does not have a pain doctor??  I told her I am not comfortable prescribing, and have kept her on until she could be seen.  Does she have another provider in mind?? If she has worsening pain, fever or redness, may need to see someone prior to Monday for skin infection.

## 2016-10-12 ENCOUNTER — Encounter: Payer: Self-pay | Admitting: Family Medicine

## 2016-10-12 ENCOUNTER — Ambulatory Visit (INDEPENDENT_AMBULATORY_CARE_PROVIDER_SITE_OTHER): Payer: Medicare Other | Admitting: Family Medicine

## 2016-10-12 VITALS — BP 124/78 | HR 64 | Temp 97.3°F | Resp 18 | Ht 64.0 in | Wt 209.0 lb

## 2016-10-12 DIAGNOSIS — K458 Other specified abdominal hernia without obstruction or gangrene: Secondary | ICD-10-CM | POA: Diagnosis not present

## 2016-10-12 DIAGNOSIS — S31109A Unspecified open wound of abdominal wall, unspecified quadrant without penetration into peritoneal cavity, initial encounter: Secondary | ICD-10-CM

## 2016-10-12 DIAGNOSIS — G894 Chronic pain syndrome: Secondary | ICD-10-CM

## 2016-10-12 DIAGNOSIS — K469 Unspecified abdominal hernia without obstruction or gangrene: Secondary | ICD-10-CM | POA: Insufficient documentation

## 2016-10-12 DIAGNOSIS — M7541 Impingement syndrome of right shoulder: Secondary | ICD-10-CM | POA: Diagnosis not present

## 2016-10-12 MED ORDER — OXYCODONE HCL 20 MG PO TABS: 20.0000 mg | ORAL_TABLET | Freq: Four times a day (QID) | ORAL | 0 refills | Status: DC

## 2016-10-12 NOTE — Telephone Encounter (Signed)
List to be given at appt today

## 2016-10-12 NOTE — Progress Notes (Signed)
Chief Complaint  Patient presents with  . Wound Check  Here for abdominal pain and swelling. It is at the site of her wound of last month Multiple abdominal surgeries over the years No problem with bowels or digestion/ no fever  Also has chronic pain.  Is referred to pain specialty.  Wants to try to reduce her oxycodone.  Will go from 90 a day to 80 a day and see how she does.  She knows I will keep her on this med only until she sees Dr Hardin Negus  She saw Dr Lorin Mercy for her shoulder.  She is not interested in additional MRI or surgery at this time.    Patient Active Problem List   Diagnosis Date Noted  . Abdominal hernia without obstruction or gangrene 10/12/2016  . Acute pain of left shoulder 10/08/2016  . Varicose veins of bilateral lower extremities with other complications 57/84/6962  . Essential hypertension 03/16/2016  . Lymphedema 03/16/2016  . Breast cancer in female [facility]) 03/16/2016  . Hypothyroidism 03/16/2016  . 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 Prediabetes 03/16/2016  . Obesity 03/16/2016  . Hyperlipidemia 03/16/2016  . Chronic pain syndrome 03/16/2016    Outpatient Encounter Prescriptions as of 10/12/2016  Medication Sig  . atenolol (TENORMIN) 50 MG tablet Take 1 tablet (50 mg total) by mouth daily.  . cholecalciferol (VITAMIN D) 1000 units tablet Take 1,000 Units by mouth daily.  . clindamycin (CLEOCIN) 300 MG capsule Take 1 capsule (300 mg total) by mouth 3 (three) times daily.  Marland Kitchen gabapentin (NEURONTIN) 300 MG capsule Take 1 capsule (300 mg total) by mouth 3 (three) times daily.  . hydrochlorothiazide (HYDRODIURIL) 25 MG tablet Take 1 tablet (25 mg total) by mouth daily.  Marland Kitchen ibuprofen (ADVIL,MOTRIN) 800 MG tablet TAKE (1) TABLET BY MOUTH THREE TIMES DAILY AS NEEDED.  Marland Kitchen levothyroxine (SYNTHROID) 175 MCG tablet Take 1 tablet (175 mcg total) by mouth daily before breakfast.  .  metroNIDAZOLE (METROGEL) 1 % gel Apply topically daily.  Marland Kitchen nystatin (MYCOSTATIN/NYSTOP) powder Apply topically 4 (four) times daily.  . [DISCONTINUED] oxycodone (ROXICODONE) 30 MG immediate release tablet Take 1 tablet (30 mg total) by mouth every 6 (six) hours as needed for pain. Maximum daily dose 3 tabs  . Oxycodone HCl 20 MG TABS Take 1 tablet (20 mg total) by mouth 4 (four) times daily.   No facility-administered encounter medications on file as of 10/12/2016.     Allergies  Allergen Reactions  . Penicillins Anaphylaxis    Childhood allergy-unknown  . Sulfa Antibiotics Swelling    angioedema  . Demerol [Meperidine] Other (See Comments)    anxiety  . Morphine And Related Itching    side effect     Review of Systems  Constitutional: Negative for activity change, appetite change, chills and fever.  Respiratory: Negative for cough and shortness of breath.   Cardiovascular: Negative for chest pain and palpitations.  Gastrointestinal: Positive for abdominal distention and abdominal pain. Negative for constipation and diarrhea.  Genitourinary: Negative for difficulty urinating and frequency.  Musculoskeletal: Positive for arthralgias, back pain and gait problem.       Chronic pain  Skin: Positive for color change and wound.  Psychiatric/Behavioral: Positive for dysphoric mood and sleep disturbance.  All other systems reviewed and are negative.   BP 124/78 (BP Location: Left Arm, Patient Position: Sitting, Cuff Size: Large)   Pulse 64   Temp 97.3 F (  36.3 C) (Temporal)   Resp 18   Ht 5\' 4"  (1.626 m)   Wt 209 lb (94.8 kg)   SpO2 98%   BMI 35.87 kg/m   Physical Exam  Constitutional: She is oriented to person, place, and time. She appears well-developed and well-nourished. She appears distressed.  Upset, mildly tearful  HENT:  Head: Normocephalic and atraumatic.  Mouth/Throat: Oropharynx is clear and moist.  Eyes: Conjunctivae are normal. Pupils are equal, round, and  reactive to light.  Neck: Normal range of motion. No tracheal deviation present.  Cardiovascular: Normal rate, regular rhythm and normal heart sounds.   Pulmonary/Chest: Effort normal and breath sounds normal. She has no wheezes.  Abdominal:    Lymphadenopathy:    She has no cervical adenopathy.  Neurological: She is alert and oriented to person, place, and time.  Skin: Skin is warm and dry.  Small eschar at tip of bulge - less than 1 cm, NO erythema or drainage  Psychiatric:  Labile.  Frustrated.  Intelligent, articulate.    ASSESSMENT/PLAN:  1. Other specified abdominal hernia without obstruction or gangrene - CT Abdomen Pelvis W Contrast; Future  2. Chronic pain syndrome Referred.  Will reduce oxycodone  3. Impingement syndrome of left shoulder Seen by ortho  4. Wound of abdomen Healing.  Greater than 50% of this visit was spent in counseling and coordinating care.  Total face to face time:   25 min discussing pain management and options. Specialty referral for abdomen.   Patient Instructions  Need to get a cat scan to evaluate the abdominal bulge  See Dr Hardin Negus in the pain clinic I will continue the oxycodone until you are seen We will try 20 mg pain pills 4 times a day  I will let you know your test results You may need a surgical consult      Raylene Everts, MD

## 2016-10-12 NOTE — Progress Notes (Signed)
Ms Kopplin did not like her orhtopedic provider for shoulder pain, I misunderstood that she "walked out on" her pain specialist consult.  We will continue to provide her pain medicine until she sees a pain specialist.  [facility]

## 2016-10-12 NOTE — Patient Instructions (Signed)
Need to get a cat scan to evaluate the abdominal bulge  See Dr Hardin Negus in the pain clinic I will continue the oxycodone until you are seen We will try 20 mg pain pills 4 times a day  I will let you know your test results You may need a surgical consult

## 2016-10-14 ENCOUNTER — Encounter: Payer: Medicare Other | Attending: Family Medicine | Admitting: Nutrition

## 2016-10-14 VITALS — Ht 64.0 in | Wt 209.0 lb

## 2016-10-14 DIAGNOSIS — Z6835 Body mass index (BMI) 35.0-35.9, adult: Principal | ICD-10-CM

## 2016-10-14 DIAGNOSIS — E66812 Obesity, class 2: Secondary | ICD-10-CM

## 2016-10-14 DIAGNOSIS — R739 Hyperglycemia, unspecified: Secondary | ICD-10-CM

## 2016-10-14 NOTE — Progress Notes (Signed)
  Medical Nutrition Therapy:  Appt start time: 1130  end time:  1200 Assessment  Primary concerns today  Prediabetes, Obesity and Hyperlipidemia, Lost 3 lbs since January 2018 .  Trying to avoid gluten foods to help with her arthritis and GI issues. Is drinking ACV and Lemon juice. Complains of serious arthritis pain today. Wants to do water aerobics but doesn't like the cold water.     Says she can't eat all the food that is suggested at meal times but then wants to eat a snack between meals. Not physically active right now due to pain. Taking 20 mg of Oxycodone 4 times per day per med list.  Says she isn't taking Gabapentin.     Diet remains inconsistent to meet her needs for needed weight loss. H/o elevated CHOL and LDL. NO recent A1C or cmet done.. Last A1C was 5.9% from [place] June 2017. Preferred Learning Style:   Visual  Learning Readiness:   Ready  Change in progress   MEDICATIONS: see list   DIETARY INTAKE   24-hr recall:  B ( AM):Pineapple  1 cup,  Water  Snk ( AM): none  L ( PM): Taco salad,   Snk ( PM):  D ( PM):  Ground beef, onion, spinach  water Snk ( PM): Beverages: water  Usual physical activity: ADL  Estimated energy needs: 1500 calories 170 g carbohydrates 112 g protein 42 g fat  Progress Towards Goal(s):  In progress.   Nutritional Diagnosis:  NB-1.1 Food and nutrition-related knowledge deficit As related to Obesity and prediabetes.  As evidenced by BMI> 40 and A1C 5.8%    Intervention:  Nutrition and Diabetes education provided on My Plate, CHO counting, meal planning, portion sizes, timing of meals, avoiding snacks between meals unless having a low blood sugar, target ranges for A1C and blood sugars, signs/symptoms and treatment of hyper/hypoglycemia,  benefits of exercising 30 minutes per day and prevention of complications of DM. Low Salt Hearty Healthy Gluten Free Diet   Goals 1.  Look into the water aerobics.  2. Increase fresh fruits and  vegetables. 3. Avoid processed foods 4. Continue to drink only water 5. Lose 1-2 lbs per week. Increase fiber in diet.  Teaching Method Utilized:  Visual Auditory Hands on  Handouts given during visit include:  The Plate Method   Meal Plan Card  Barriers to learning/adherence to lifestyle change: none  Demonstrated degree of understanding via:  Teach Back   Monitoring/Evaluation:  Dietary intake, exercise, meal planning, and body weight in 6-8 weeks. (s). She would benefit from a Behavioral Health Referral

## 2016-10-14 NOTE — Patient Instructions (Addendum)
Goal 1.  Look into the water aerobics.  2. Increase fresh fruits and vegetables. 3. Avoid processed foods 4. Continue to drink only water 5. Lose 1-2 lbs per week. Increase fiber in diet.

## 2016-10-22 ENCOUNTER — Telehealth: Payer: Self-pay | Admitting: Family Medicine

## 2016-10-22 NOTE — Telephone Encounter (Signed)
Rest, ice,heat, rub like aspercreme or biofreeze, salon pas patches (OTC)  ???

## 2016-10-22 NOTE — Telephone Encounter (Signed)
Patient wants to know what she can do for arthritis in her shoulder ?  She seen an orthopedic (Dr.Yates=[facility], [place] )  Recently, she said she was not happy with her care because she felt like he labeled her as a criminal.

## 2016-10-27 NOTE — Telephone Encounter (Signed)
Called and spoke to Teresa Anderson about her shoulder pain States nothing is helping her pain. States she is taking pain every 6 hours. Has appt June 6 with pain md.

## 2016-11-04 ENCOUNTER — Ambulatory Visit (HOSPITAL_COMMUNITY)
Admission: RE | Admit: 2016-11-04 | Discharge: 2016-11-04 | Disposition: A | Discharge status: Home / Self Care | Payer: Medicare Other | Source: Ambulatory Visit | Attending: Family Medicine | Admitting: Family Medicine

## 2016-11-04 DIAGNOSIS — E65 Localized adiposity: Secondary | ICD-10-CM | POA: Insufficient documentation

## 2016-11-04 DIAGNOSIS — Z90710 Acquired absence of both cervix and uterus: Secondary | ICD-10-CM | POA: Diagnosis not present

## 2016-11-04 DIAGNOSIS — Z780 Asymptomatic menopausal state: Secondary | ICD-10-CM | POA: Insufficient documentation

## 2016-11-04 DIAGNOSIS — N9489 Other specified conditions associated with female genital organs and menstrual cycle: Secondary | ICD-10-CM | POA: Insufficient documentation

## 2016-11-04 DIAGNOSIS — K458 Other specified abdominal hernia without obstruction or gangrene: Secondary | ICD-10-CM

## 2016-11-04 DIAGNOSIS — I700 Atherosclerosis of aorta: Secondary | ICD-10-CM | POA: Diagnosis not present

## 2016-11-04 LAB — POCT I-STAT CREATININE: CREATININE: 1.1 mg/dL — AB (ref 0.44–1.00)

## 2016-11-04 MED ORDER — IOPAMIDOL (ISOVUE-300) INJECTION 61%
100.0000 mL | Freq: Once | INTRAVENOUS | Status: AC | PRN
  Administered 2016-11-04: 100 mL via INTRAVENOUS

## 2016-11-05 ENCOUNTER — Telehealth: Payer: Self-pay | Admitting: Family Medicine

## 2016-11-05 ENCOUNTER — Other Ambulatory Visit: Payer: Self-pay

## 2016-11-05 MED ORDER — OXYCODONE HCL 20 MG PO TABS
20.0000 mg | ORAL_TABLET | Freq: Four times a day (QID) | ORAL | 0 refills | Status: DC
Start: 2016-11-05 — End: 2016-11-06

## 2016-11-05 NOTE — Telephone Encounter (Signed)
rx done, [place]en aware.

## 2016-11-05 NOTE — Telephone Encounter (Signed)
PATIENT HERE IN OFFICE NOW:  Pt requesting oxycodone for "all over pain".

## 2016-11-06 ENCOUNTER — Other Ambulatory Visit: Payer: Self-pay

## 2016-11-06 ENCOUNTER — Telehealth: Payer: Self-pay | Admitting: Family Medicine

## 2016-11-06 MED ORDER — OXYCODONE HCL 20 MG PO TABS: 20.0000 mg | ORAL_TABLET | ORAL | 0 refills | Status: DC | PRN

## 2016-11-09 ENCOUNTER — Encounter: Payer: Self-pay | Admitting: Family Medicine

## 2016-11-09 ENCOUNTER — Ambulatory Visit: Payer: Medicare Other | Admitting: Nutrition

## 2016-11-24 ENCOUNTER — Ambulatory Visit: Payer: Medicare Other | Admitting: Nutrition

## 2016-11-26 ENCOUNTER — Ambulatory Visit: Payer: Medicare Other | Admitting: Family Medicine

## 2016-12-01 ENCOUNTER — Telehealth (INDEPENDENT_AMBULATORY_CARE_PROVIDER_SITE_OTHER): Payer: Self-pay | Admitting: Orthopaedic Surgery

## 2016-12-01 NOTE — Telephone Encounter (Signed)
I mailed medical records release form to patient to complete for her to get copy of records.

## 2016-12-02 ENCOUNTER — Other Ambulatory Visit: Payer: Self-pay | Admitting: Family Medicine

## 2016-12-02 DIAGNOSIS — G894 Chronic pain syndrome: Secondary | ICD-10-CM | POA: Diagnosis not present

## 2016-12-02 DIAGNOSIS — K5900 Constipation, unspecified: Secondary | ICD-10-CM | POA: Diagnosis not present

## 2016-12-02 DIAGNOSIS — G4700 Insomnia, unspecified: Secondary | ICD-10-CM | POA: Diagnosis not present

## 2016-12-02 DIAGNOSIS — M47817 Spondylosis without myelopathy or radiculopathy, lumbosacral region: Secondary | ICD-10-CM | POA: Diagnosis not present

## 2016-12-02 DIAGNOSIS — M7552 Bursitis of left shoulder: Secondary | ICD-10-CM | POA: Diagnosis not present

## 2016-12-02 DIAGNOSIS — M7061 Trochanteric bursitis, right hip: Secondary | ICD-10-CM | POA: Diagnosis not present

## 2016-12-02 DIAGNOSIS — M19012 Primary osteoarthritis, left shoulder: Secondary | ICD-10-CM | POA: Diagnosis not present

## 2016-12-02 DIAGNOSIS — Z79891 Long term (current) use of opiate analgesic: Secondary | ICD-10-CM | POA: Diagnosis not present

## 2016-12-09 ENCOUNTER — Ambulatory Visit: Payer: Medicare Other | Admitting: Nutrition

## 2016-12-14 ENCOUNTER — Ambulatory Visit (INDEPENDENT_AMBULATORY_CARE_PROVIDER_SITE_OTHER): Payer: Medicare Other

## 2016-12-14 ENCOUNTER — Ambulatory Visit (INDEPENDENT_AMBULATORY_CARE_PROVIDER_SITE_OTHER): Payer: Medicare Other | Admitting: Family Medicine

## 2016-12-14 ENCOUNTER — Encounter: Payer: Self-pay | Admitting: Family Medicine

## 2016-12-14 VITALS — BP 122/84 | HR 60 | Temp 97.6°F | Resp 16 | Ht 64.0 in | Wt 207.0 lb

## 2016-12-14 VITALS — BP 128/84 | HR 60 | Temp 97.6°F | Ht 64.0 in | Wt 207.1 lb

## 2016-12-14 DIAGNOSIS — E785 Hyperlipidemia, unspecified: Secondary | ICD-10-CM

## 2016-12-14 DIAGNOSIS — G894 Chronic pain syndrome: Secondary | ICD-10-CM | POA: Diagnosis not present

## 2016-12-14 DIAGNOSIS — Z85828 Personal history of other malignant neoplasm of skin: Secondary | ICD-10-CM | POA: Diagnosis not present

## 2016-12-14 DIAGNOSIS — E039 Hypothyroidism, unspecified: Secondary | ICD-10-CM

## 2016-12-14 DIAGNOSIS — R7303 Prediabetes: Secondary | ICD-10-CM | POA: Diagnosis not present

## 2016-12-14 DIAGNOSIS — M25512 Pain in left shoulder: Secondary | ICD-10-CM | POA: Diagnosis not present

## 2016-12-14 DIAGNOSIS — I10 Essential (primary) hypertension: Secondary | ICD-10-CM | POA: Diagnosis not present

## 2016-12-14 DIAGNOSIS — Z0000 Encounter for general adult medical examination without abnormal findings: Secondary | ICD-10-CM

## 2016-12-14 LAB — CBC
HEMATOCRIT: 40.5 % (ref 35.0–45.0)
Hemoglobin: 13.2 g/dL (ref 11.7–15.5)
MCH: 29.7 pg (ref 27.0–33.0)
MCHC: 32.6 g/dL (ref 32.0–36.0)
MCV: 91 fL (ref 80.0–100.0)
MPV: 9.9 fL (ref 7.5–12.5)
PLATELETS: 240 10*3/uL (ref 140–400)
RBC: 4.45 MIL/uL (ref 3.80–5.10)
RDW: 13.5 % (ref 11.0–15.0)
WBC: 5.1 10*3/uL (ref 3.8–10.8)

## 2016-12-14 LAB — COMPLETE METABOLIC PANEL WITH GFR
ALT: 13 U/L (ref 6–29)
AST: 19 U/L (ref 10–35)
Albumin: 4.1 g/dL (ref 3.6–5.1)
Alkaline Phosphatase: 86 U/L (ref 33–130)
BUN: 31 mg/dL — AB (ref 7–25)
CHLORIDE: 99 mmol/L (ref 98–110)
CO2: 29 mmol/L (ref 20–31)
CREATININE: 0.97 mg/dL — AB (ref 0.60–0.93)
Calcium: 9.4 mg/dL (ref 8.6–10.4)
GFR, Est African American: 65 mL/min (ref >=60)
GFR, Est Non African American: 57 mL/min — ABNORMAL LOW (ref >=60)
Glucose, Bld: 102 mg/dL — ABNORMAL HIGH (ref 65–99)
Potassium: 4.5 mmol/L (ref 3.5–5.3)
Sodium: 138 mmol/L (ref 135–146)
Total Bilirubin: 0.6 mg/dL (ref 0.2–1.2)
Total Protein: 7.3 g/dL (ref 6.1–8.1)

## 2016-12-14 LAB — LIPID PANEL
CHOL/HDL RATIO: 5 ratio — AB (ref <5.0)
Cholesterol: 219 mg/dL — ABNORMAL HIGH (ref <200)
HDL: 44 mg/dL — AB (ref >50)
LDL CALC: 130 mg/dL — AB (ref <100)
Triglycerides: 223 mg/dL — ABNORMAL HIGH (ref <150)
VLDL: 45 mg/dL — ABNORMAL HIGH (ref <30)

## 2016-12-14 LAB — TSH: TSH: 2.43 mIU/L

## 2016-12-14 NOTE — Progress Notes (Signed)
Chief Complaint  Patient presents with  . Follow-up   Routine follow up Went to the pain clinic and her medicine is being changed She is currently having increased pain but understands this is a process she needs to go through to get established in a new clinic with new medicines for her safety.  Has not had lab work for almost a year.  Needs follow up of lipids, sugar, thyroid and lytes on her current meds Her left shoulder pain is till an issue.  Would like an orthopedic closer to home.  Will see about Dr Luna Glasgow evaluating shoulder ans she does not want surgery. Patient Active Problem List   Diagnosis Date Noted  . Abdominal hernia without obstruction or gangrene 10/12/2016  . Acute pain of left shoulder 10/08/2016  . Varicose veins of bilateral lower extremities with other complications 16/03/9603  . Essential hypertension 03/16/2016  . Lymphedema 03/16/2016  . Breast cancer in female [facility]) 03/16/2016  . Hypothyroidism 03/16/2016  . 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 Prediabetes 03/16/2016  . Obesity 03/16/2016  . Hyperlipidemia 03/16/2016  . Chronic pain syndrome 03/16/2016    Outpatient Encounter Prescriptions as of 12/14/2016  Medication Sig  . atenolol (TENORMIN) 50 MG tablet Take 1 tablet (50 mg total) by mouth daily.  . buprenorphine (BUTRANS - DOSED MCG/HR) 5 MCG/HR PTWK patch Place 5 mcg onto the skin once a week.  . cholecalciferol (VITAMIN D) 1000 units tablet Take 1,000 Units by mouth daily.  Marland Kitchen gabapentin (NEURONTIN) 300 MG capsule Take 1 capsule (300 mg total) by mouth 3 (three) times daily.  . hydrochlorothiazide (HYDRODIURIL) 25 MG tablet Take 1 tablet (25 mg total) by mouth daily.  Marland Kitchen ibuprofen (ADVIL,MOTRIN) 800 MG tablet TAKE (1) TABLET BY MOUTH THREE TIMES DAILY AS NEEDED.  Marland Kitchen levothyroxine (SYNTHROID) 175 MCG tablet Take 1 tablet (175 mcg total) by mouth daily before breakfast.    . Oxycodone HCl 20 MG TABS Take 1 tablet (20 mg total) by mouth every 4 (four) hours as needed. (Patient taking differently: Take 10 mg by mouth 5 (five) times daily as needed. )   No facility-administered encounter medications on file as of 12/14/2016.     Allergies  Allergen Reactions  . Penicillins Anaphylaxis    Childhood allergy-unknown  . Sulfa Antibiotics Swelling    angioedema  . Demerol [Meperidine] Other (See Comments)    anxiety  . Morphine And Related Itching    side effect     Review of Systems  Constitutional: Negative for activity change, appetite change, chills and fever.  Respiratory: Negative for cough and shortness of breath.   Cardiovascular: Negative for chest pain and palpitations.  Gastrointestinal: Negative for abdominal distention, abdominal pain, constipation and diarrhea.  Genitourinary: Negative for difficulty urinating and frequency.  Musculoskeletal: Positive for arthralgias, back pain and gait problem.       Chronic pain  Skin: Negative for color change and wound.  Psychiatric/Behavioral: Positive for dysphoric mood and sleep disturbance.  All other systems reviewed and are negative.   BP 122/84 (BP Location: Right Arm, Patient Position: Sitting, Cuff Size: Normal)   Pulse 60   Temp 97.6 F (36.4 C) (Temporal)   Resp 16   Ht 5\' 4"  (1.626 m)   Wt 207 lb (93.9 kg)   SpO2 99%   BMI 35.53 kg/m   Physical Exam  Constitutional: She is oriented to person, place, and time.  She appears well-developed and well-nourished.  HENT:  Head: Normocephalic and atraumatic.  Mouth/Throat: Oropharynx is clear and moist.  Eyes: Conjunctivae are normal. Pupils are equal, round, and reactive to light.  Cardiovascular: Normal rate and regular rhythm.   Murmur heard. Pulmonary/Chest: Effort normal and breath sounds normal. No respiratory distress.  Musculoskeletal:  Very limited ROM left shoulder  Neurological: She is alert and oriented to person, place, and  time.  Skin:  SK on back.  Lesion right shin, may be a BCC  Psychiatric: She has a normal mood and affect. Her behavior is normal.    ASSESSMENT/PLAN:  1. Essential hypertension  - Vitamin B12 - CBC - COMPLETE METABOLIC PANEL WITH GFR - Hemoglobin A1c - Lipid panel - Urinalysis, Routine w reflex microscopic  2. Hypothyroidism, unspecified type - TSH  3. Chronic pain syndrome  4. Acute pain of left shoulder  - Ambulatory referral to Orthopedic Surgery  5. Hyperlipidemia, unspecified hyperlipidemia type  6. Prediabetes   Patient Instructions  No change in medicine I am referring to Dr Luna Glasgow for opinion on the shoulder  You are due for lab testing I will send you a letter with your test results.  If there is anything of concern, we will call right away.   See me in 3 months   Raylene Everts, MD

## 2016-12-14 NOTE — Patient Instructions (Signed)
Teresa Anderson , Thank you for taking time to come for your Medicare Wellness Visit. I appreciate your ongoing commitment to your health goals. Please review the following plan we discussed and let me know if I can assist you in the future.   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 I have provided a copy for you to complete at home and have notarized. Once this is complete please bring a copy in to our office so we can scan it into your chart.  Conditions/risks identified: Obese, recommend starting a routine exercise program at least 3 days a week for 30-45 minutes at a time as tolerated.   Next appointment: Follow up in 1 year for your annual wellness visit.  Preventive Care 40 Years and Older, Female Preventive care refers to lifestyle choices and visits with your health care provider that can promote health and wellness. What does preventive care include?  A yearly physical exam. This is also called an annual well check.  Dental exams once or twice a year.  Routine eye exams. Ask your health care provider how often you should have your eyes checked.  Personal lifestyle choices, including:  Daily care of your teeth and gums.  Regular physical activity.  Eating a healthy diet.  Avoiding tobacco and drug use.  Limiting alcohol use.  Practicing safe sex.  Taking low-dose aspirin every day.  Taking vitamin and mineral supplements as recommended by your health care provider. What happens during an annual well check? The  services and screenings done by your health care provider during your annual well check will depend on your age, overall health, lifestyle risk factors, and family history of disease. Counseling  Your health care provider may ask you questions about your:  Alcohol use.  Tobacco use.  Drug use.  Emotional well-being.  Home and relationship well-being.  Sexual activity.  Eating habits.  History of falls.  Memory and ability to understand (cognition).  Work and work Statistician.  Reproductive health. Screening  You may have the following tests or measurements:  Height, weight, and BMI.  Blood pressure.  Lipid and cholesterol levels. These may be checked every 5 years, or more frequently if you are over 47 years old.  Skin check.  Lung cancer screening. You may have this screening every year starting at age 47 if you have a 30-pack-year history of smoking and currently smoke or have quit within the past 15 years.  Fecal occult blood test (FOBT) of the stool. You may have this test every year starting at age 42.  Flexible sigmoidoscopy or colonoscopy. You may have a sigmoidoscopy every 5 years or a colonoscopy every 10 years starting at age 26.  Hepatitis C blood test.  Hepatitis B blood test.  Sexually transmitted disease (STD) testing.  Diabetes screening. This is done by checking your blood sugar (glucose) after you have not eaten for a while (fasting). You may have this done every 1-3 years.  Bone density scan. This is done to screen for osteoporosis. You may have this done starting at age 29.  Mammogram. This may be done every 1-2 years. Talk to your health care provider  about how often you should have regular mammograms. Talk with your health care provider about your test results, treatment options, and if necessary, the need for more tests. Vaccines  Your health care provider may recommend certain vaccines, such as:  Influenza vaccine. This is recommended  every year.  Tetanus, diphtheria, and acellular pertussis (Tdap, Td) vaccine. You may need a Td booster every 10 years.  Zoster vaccine. You may need this after age 33.  Pneumococcal 13-valent conjugate (PCV13) vaccine. One dose is recommended after age 39.  Pneumococcal polysaccharide (PPSV23) vaccine. One dose is recommended after age 33. Talk to your health care provider about which screenings and vaccines you need and how often you need them. This information is not intended to replace advice given to you by your health care provider. Make sure you discuss any questions you have with your health care provider. Document Released: 07/12/2015 Document Revised: 03/04/2016 Document Reviewed: 04/16/2015 Elsevier Interactive Patient Education  2017 [employer] Prevention in the Home Falls can cause injuries. They can happen to people of all ages. There are many things you can do to make your home safe and to help prevent falls. What can I do on the outside of my home?  Regularly fix the edges of walkways and driveways and fix any cracks.  Remove anything that might make you trip as you walk through a door, such as a raised step or threshold.  Trim any bushes or trees on the path to your home.  Use bright outdoor lighting.  Clear any walking paths of anything that might make someone trip, such as rocks or tools.  Regularly check to see if handrails are loose or broken. Make sure that both sides of any steps have handrails.  Any raised decks and porches should have guardrails on the edges.  Have any leaves, snow, or ice cleared regularly.  Use sand or salt on walking paths during winter.  Clean up any spills in your garage right away. This includes oil or grease spills. What can I do in the bathroom?  Use night lights.  Install grab bars by the toilet and in the tub and shower. Do not use towel bars as grab bars.  Use non-skid mats or decals in the tub or shower.  If  you need to sit down in the shower, use a plastic, non-slip stool.  Keep the floor dry. Clean up any water that spills on the floor as soon as it happens.  Remove soap buildup in the tub or shower regularly.  Attach bath mats securely with double-sided non-slip rug tape.  Do not have throw rugs and other things on the floor that can make you trip. What can I do in the bedroom?  Use night lights.  Make sure that you have a light by your bed that is easy to reach.  Do not use any sheets or blankets that are too big for your bed. They should not hang down onto the floor.  Have a firm chair that has side arms. You can use this for support while you get dressed.  Do not have throw rugs and other things on the floor that can make you trip. What can I do in the kitchen?  Clean up any spills right away.  Avoid walking on wet floors.  Keep items that you use a lot in easy-to-reach places.  If you need to reach something above you, use a strong step stool that has a grab bar.  Keep electrical cords out of the way.  Do not use floor polish or wax that makes floors slippery. If you must use wax, use non-skid floor wax.  Do not have throw rugs and other things on the floor that can make you trip. What can I do with my stairs?  Do not leave any items on the stairs.  Make sure that there are handrails on both sides of the stairs and use them. Fix handrails that are broken or loose. Make sure that handrails are as long as the stairways.  Check any carpeting to make sure that it is firmly attached to the stairs. Fix any carpet that is loose or worn.  Avoid having throw rugs at the top or bottom of the stairs. If you do have throw rugs, attach them to the floor with carpet tape.  Make sure that you have a light switch at the top of the stairs and the bottom of the stairs. If you do not have them, ask someone to add them for you. What else can I do to help prevent falls?  Wear shoes  that:  Do not have high heels.  Have rubber bottoms.  Are comfortable and fit you well.  Are closed at the toe. Do not wear sandals.  If you use a stepladder:  Make sure that it is fully opened. Do not climb a closed stepladder.  Make sure that both sides of the stepladder are locked into place.  Ask someone to hold it for you, if possible.  Clearly mark and make sure that you can see:  Any grab bars or handrails.  First and last steps.  Where the edge of each step is.  Use tools that help you move around (mobility aids) if they are needed. These include:  Canes.  Walkers.  Scooters.  Crutches.  Turn on the lights when you go into a dark area. Replace any light bulbs as soon as they burn out.  Set up your furniture so you have a clear path. Avoid moving your furniture around.  If any of your floors are uneven, fix them.  If there are any pets around you, be aware of where they are.  Review your medicines with your doctor. Some medicines can make you feel dizzy. This can increase your chance of falling. Ask your doctor what other things that you can do to help prevent falls. This information is not intended to replace advice given to you by your health care provider. Make sure you discuss any questions you have with your health care provider. Document Released: 04/11/2009 Document Revised: 11/21/2015 Document Reviewed: 07/20/2014 Elsevier Interactive Patient Education  2017 [employer].

## 2016-12-14 NOTE — Patient Instructions (Signed)
No change in medicine I am referring to Dr Luna Glasgow for opinion on the shoulder  You are due for lab testing I will send you a letter with your test results.  If there is anything of concern, we will call right away.   See me in 3 months

## 2016-12-14 NOTE — Progress Notes (Signed)
Subjective:   Teresa Anderson is a 76 y.o. female who presents for an Initial Medicare Annual Wellness Visit.  Review of Systems:  Cardiac Risk Factors include: dyslipidemia;hypertension;advanced age (>39men, >29 women);obesity (BMI >30kg/m2);sedentary lifestyle     Objective:    Today's Vitals   12/14/16 1023 12/14/16 1030  BP: 128/84   Pulse: 60   Temp: 97.6 F (36.4 C)   TempSrc: Oral   Weight: 207 lb 1.3 oz (93.9 kg)   Height: 5\' 4"  (1.626 m)   PainSc:  6    Body mass index is 35.55 kg/m.   Current Medications (verified) Outpatient Encounter Prescriptions as of 12/14/2016  Medication Sig  . atenolol (TENORMIN) 50 MG tablet Take 1 tablet (50 mg total) by mouth daily.  . buprenorphine (BUTRANS - DOSED MCG/HR) 5 MCG/HR PTWK patch Place 5 mcg onto the skin once a week.  . cholecalciferol (VITAMIN D) 1000 units tablet Take 1,000 Units by mouth daily.  . hydrochlorothiazide (HYDRODIURIL) 25 MG tablet Take 1 tablet (25 mg total) by mouth daily.  Marland Kitchen ibuprofen (ADVIL,MOTRIN) 800 MG tablet TAKE (1) TABLET BY MOUTH THREE TIMES DAILY AS NEEDED.  Marland Kitchen levothyroxine (SYNTHROID) 175 MCG tablet Take 1 tablet (175 mcg total) by mouth daily before breakfast.  . Oxycodone HCl 20 MG TABS Take 1 tablet (20 mg total) by mouth every 4 (four) hours as needed. (Patient taking differently: Take 10 mg by mouth 5 (five) times daily as needed. )  . gabapentin (NEURONTIN) 300 MG capsule Take 1 capsule (300 mg total) by mouth 3 (three) times daily. (Patient not taking: Reported on 10/14/2016)  . [DISCONTINUED] clindamycin (CLEOCIN) 300 MG capsule Take 1 capsule (300 mg total) by mouth 3 (three) times daily. (Patient not taking: Reported on 10/14/2016)  . [DISCONTINUED] metroNIDAZOLE (METROGEL) 1 % gel Apply topically daily.  . [DISCONTINUED] nystatin (MYCOSTATIN/NYSTOP) powder Apply topically 4 (four) times daily.   No facility-administered encounter medications on file as of 12/14/2016.     Allergies  (verified) Penicillins; Sulfa antibiotics; Demerol [meperidine]; and Morphine and related   History: Past Medical History:  Diagnosis Date  . Allergy   . Arthritis   . Asthma   . Blood transfusion without reported diagnosis    with cancer surgery  . Cancer ([place])    breast and skin  . Diabetes mellitus without complication ([place])   . Hyperlipidemia   . Hypertension   . Lymphedema of leg   . Thyroid disease   . Varicose veins of bilateral lower extremities with other complications    Past Surgical History:  Procedure Laterality Date  . ABDOMINAL HYSTERECTOMY    . BREAST SURGERY     bilat  . CESAREAN SECTION     x4  . CHOLECYSTECTOMY    . COSMETIC SURGERY     reconstruction  . MASTECTOMY     bilateral   Family History  Problem Relation Age of Onset  . Heart disease Mother 58       heart attack  . COPD Father        former smoker  . Pancreatic cancer Father   . Celiac disease Daughter   . Cancer Maternal Aunt        lung-smoker   Social History   Occupational History  . Statistician     retired  . pre school    Social History Main Topics  . Smoking status: Never Smoker  . Smokeless tobacco: Never Used  . Alcohol use No  .  Drug use: No  . Sexual activity: Not Currently    Tobacco Counseling Counseling given: Not Answered   Activities of Daily Living In your present state of health, do you have any difficulty performing the following activities: 12/14/2016 10/12/2016  Hearing? N N  Vision? N N  Difficulty concentrating or making decisions? N N  Walking or climbing stairs? Y N  Dressing or bathing? Y N  Doing errands, shopping? N N  Preparing Food and eating ? N -  Using the Toilet? N -  In the past six months, have you accidently leaked urine? N -  Do you have problems with loss of bowel control? N -  Managing your Medications? N -  Managing your Finances? N -  Housekeeping or managing your Housekeeping? N -  Some recent data might be hidden     Immunizations and Health Maintenance Immunization History  Administered Date(s) Administered  . Tdap 01/29/2016  . Zoster 03/08/2012   There are no preventive care reminders to display for this patient.  Patient Care Team: Raylene Everts, MD as PCP - General (Family Medicine)  Indicate any recent Medical Services you may have received from other than [facility] providers in the past year (date may be approximate).     Assessment:   This is a routine wellness examination for Teresa Anderson.  Hearing/Vision screen  Visual Acuity Screening   Right eye Left eye Both eyes  Without correction:     With correction: 20/20 20/20 20/20     Dietary issues and exercise activities discussed: Current Exercise Habits: The patient does not participate in regular exercise at present, Exercise limited by: orthopedic condition(s)  Goals    . Exercise 3x per week (30 min per time)          Recommend starting a routine exercise program at least 3 days a week for 30-45 minutes at a time as tolerated.      . Weight (lb) < 175 lb (79.4 kg)      Depression Screen PHQ 2/9 Scores 12/14/2016 10/14/2016 10/12/2016 07/17/2016 05/07/2016 04/15/2016  PHQ - 2 Score 0 3 0 0 0 0  PHQ- 9 Score - 11 - - - -    Fall Risk Fall Risk  12/14/2016 10/14/2016 10/12/2016 07/17/2016 05/07/2016  Falls in the past year? No No No No No    Cognitive Function: Normal by direct observation   Screening Tests Health Maintenance  Topic Date Due  . PNA vac Low Risk Adult (1 of 2 - PCV13) 12/01/2017 (Originally 11/11/2004)  . INFLUENZA VACCINE  01/27/2017  . TETANUS/TDAP  01/28/2026  . DEXA SCAN  Completed      Plan:   I have personally reviewed and noted the following in the patient's chart:   . Medical and social history . Use of alcohol, tobacco or illicit drugs  . Current medications and supplements . Functional ability and status . Nutritional status . Physical activity . Advanced directives . List of other  physicians . Hospitalizations, surgeries, and ER visits in previous 12 months . Vitals . Screenings to include cognitive, depression, and falls . Referrals and appointments: [employer] referral sent in today.  In addition, I have reviewed and discussed with patient certain preventive protocols, quality metrics, and best practice recommendations. A written personalized care plan for preventive services as well as general preventive health recommendations were provided to patient.     Stormy Fabian, LPN   8/56/3149

## 2016-12-15 ENCOUNTER — Ambulatory Visit: Payer: Medicare Other | Admitting: Family Medicine

## 2016-12-15 ENCOUNTER — Encounter: Payer: Self-pay | Admitting: Family Medicine

## 2016-12-15 LAB — URINALYSIS, ROUTINE W REFLEX MICROSCOPIC
BILIRUBIN URINE: NEGATIVE
Glucose, UA: NEGATIVE
Hgb urine dipstick: NEGATIVE
KETONES UR: NEGATIVE
Leukocytes, UA: NEGATIVE
Nitrite: NEGATIVE
PROTEIN: NEGATIVE
Specific Gravity, Urine: 1.015 (ref 1.001–1.035)
pH: 7 (ref 5.0–8.0)

## 2016-12-15 LAB — VITAMIN B12: Vitamin B-12: 322 pg/mL (ref 200–1100)

## 2016-12-15 LAB — HEMOGLOBIN A1C
Hgb A1c MFr Bld: 6 % — ABNORMAL HIGH (ref <5.7)
Mean Plasma Glucose: 126 mg/dL

## 2016-12-23 ENCOUNTER — Ambulatory Visit (INDEPENDENT_AMBULATORY_CARE_PROVIDER_SITE_OTHER): Payer: Medicare Other | Admitting: Orthopaedic Surgery

## 2016-12-23 ENCOUNTER — Encounter: Payer: Self-pay | Admitting: Orthopaedic Surgery

## 2016-12-23 VITALS — BP 126/80 | HR 59 | Temp 96.7°F | Ht 64.0 in | Wt 208.0 lb

## 2016-12-23 DIAGNOSIS — M25512 Pain in left shoulder: Secondary | ICD-10-CM

## 2016-12-23 NOTE — Progress Notes (Signed)
Subjective:    Patient ID: Teresa Anderson, female    DOB: 23-Oct-1940, 76 y.o.   MRN: [id number]  HPI She has a long history of left shoulder pain, getting worse over the last three to four months at least. She has no trauma.  She has limited use of the left shoulder and has great difficulty lifting her hand just over her shoulder level.  She has no numbness or redness.  She has popping at times. She has generalized arthritis and has bilateral total knees.  She is seeing a doctor for pain management and is using pain patches now.  She saw Dr. Lorin Mercy for her shoulder on 10-08-16.  I have reviewed his notes.  He was concerned at that time about pain management and her use of narcotics.  He also recommended a MRI of her shoulder.  She is very claustrophobic and is leary about a MRI.  I have recommended an open unit in [place] but if she cannot tolerate it then she will need CT arthrogram of the shoulder.    She has tried rubs, ice, heat, NSAIDs with no help and the pain medicine only dulls her pain.  She has decreased function.   Review of Systems  HENT: Negative for congestion.   Respiratory: Positive for shortness of breath. Negative for cough.   Cardiovascular: Negative for chest pain and leg swelling.  Endocrine: Positive for cold intolerance.  Musculoskeletal: Positive for arthralgias and joint swelling.  Allergic/Immunologic: Positive for environmental allergies.  Psychiatric/Behavioral: The patient is nervous/anxious.    Past Medical History:  Diagnosis Date  . Allergy   . Arthritis   . Asthma   . Blood transfusion without reported diagnosis    with cancer surgery  . Cancer ([place])    breast and skin  . Diabetes mellitus without complication ([place])   . Hyperlipidemia   . Hypertension   . Lymphedema of leg   . Thyroid disease   . Varicose veins of bilateral lower extremities with other complications     Past Surgical History:  Procedure Laterality Date  . ABDOMINAL  HYSTERECTOMY    . BREAST SURGERY     bilat  . CESAREAN SECTION     x4  . CHOLECYSTECTOMY    . COSMETIC SURGERY     reconstruction  . MASTECTOMY     bilateral    Current Outpatient Prescriptions on File Prior to Visit  Medication Sig Dispense Refill  . atenolol (TENORMIN) 50 MG tablet Take 1 tablet (50 mg total) by mouth daily. 90 tablet 3  . buprenorphine (BUTRANS - DOSED MCG/HR) 5 MCG/HR PTWK patch Place 5 mcg onto the skin once a week.    . cholecalciferol (VITAMIN D) 1000 units tablet Take 1,000 Units by mouth daily.    Marland Kitchen gabapentin (NEURONTIN) 300 MG capsule Take 1 capsule (300 mg total) by mouth 3 (three) times daily. 90 capsule 1  . hydrochlorothiazide (HYDRODIURIL) 25 MG tablet Take 1 tablet (25 mg total) by mouth daily. 90 tablet 3  . ibuprofen (ADVIL,MOTRIN) 800 MG tablet TAKE (1) TABLET BY MOUTH THREE TIMES DAILY AS NEEDED. 90 tablet 5  . levothyroxine (SYNTHROID) 175 MCG tablet Take 1 tablet (175 mcg total) by mouth daily before breakfast. 90 tablet 3  . Oxycodone HCl 20 MG TABS Take 1 tablet (20 mg total) by mouth every 4 (four) hours as needed. (Patient taking differently: Take 10 mg by mouth 5 (five) times daily as needed. ) 108 tablet 0   No  current facility-administered medications on file prior to visit.     Social History   Social History  . Marital status: Divorced    Spouse name: N/A  . Number of children: 5  . Years of education: 40   Occupational History  . Statistician     retired  . pre school    Social History Main Topics  . Smoking status: Never Smoker  . Smokeless tobacco: Never Used  . Alcohol use No  . Drug use: No  . Sexual activity: Not Currently   Other Topics Concern  . Not on file   Social History Narrative  . No narrative on file    Family History  Problem Relation Age of Onset  . Heart disease Mother 66       heart attack  . COPD Father        former smoker  . Pancreatic cancer Father   . Celiac disease Daughter   .  Cancer Maternal Aunt        lung-smoker    BP 126/80   Pulse (!) 59   Temp (!) 96.7 F (35.9 C)   Ht 5\' 4"  (1.626 m)   Wt 208 lb (94.3 kg)   BMI 35.70 kg/m      Objective:   Physical Exam  Constitutional: She is oriented to person, place, and time. She appears well-developed and well-nourished.  HENT:  Head: Normocephalic and atraumatic.  Eyes: Conjunctivae and EOM are normal. Pupils are equal, round, and reactive to light.  Neck: Normal range of motion. Neck supple.  Cardiovascular: Normal rate, regular rhythm and intact distal pulses.   Pulmonary/Chest: Effort normal.  Abdominal: Soft.  Musculoskeletal: She exhibits tenderness (Very limited and painful ROM of the left shoulder.  I did not put her through a passive motion as she is in pain.  NV intact. ROM of the neck and the right shoulder negative.  Grips bilaterally normal. ).  Neurological: She is alert and oriented to person, place, and time. She displays normal reflexes. No cranial nerve deficit. She exhibits normal muscle tone. Coordination normal.  Skin: Skin is warm and dry.  Psychiatric: She has a normal mood and affect. Her behavior is normal. Judgment and thought content normal.    Prior X-rays were reviewed. Prior notes reviewed.      Assessment & Plan:   Encounter Diagnosis  Name Primary?  . Pain in joint of left shoulder Yes   I would like to get a MRI of the shoulder.  If she is unable to do this, then a CT arthrogram.  I will see her after the MRI.  Call if any problem.  Precautions discussed.   Electronically Signed Sanjuana Kava, MD 6/27/201810:49 AM

## 2016-12-31 DIAGNOSIS — G894 Chronic pain syndrome: Secondary | ICD-10-CM | POA: Diagnosis not present

## 2016-12-31 DIAGNOSIS — M19012 Primary osteoarthritis, left shoulder: Secondary | ICD-10-CM | POA: Diagnosis not present

## 2016-12-31 DIAGNOSIS — M7552 Bursitis of left shoulder: Secondary | ICD-10-CM | POA: Diagnosis not present

## 2016-12-31 DIAGNOSIS — M47817 Spondylosis without myelopathy or radiculopathy, lumbosacral region: Secondary | ICD-10-CM | POA: Diagnosis not present

## 2017-01-04 ENCOUNTER — Ambulatory Visit (HOSPITAL_COMMUNITY): Payer: Medicare Other | Attending: Family Medicine | Admitting: Specialist

## 2017-01-04 DIAGNOSIS — M25512 Pain in left shoulder: Secondary | ICD-10-CM | POA: Insufficient documentation

## 2017-01-04 DIAGNOSIS — M25612 Stiffness of left shoulder, not elsewhere classified: Secondary | ICD-10-CM | POA: Insufficient documentation

## 2017-01-04 DIAGNOSIS — R29898 Other symptoms and signs involving the musculoskeletal system: Secondary | ICD-10-CM | POA: Diagnosis not present

## 2017-01-04 DIAGNOSIS — G8929 Other chronic pain: Secondary | ICD-10-CM | POA: Insufficient documentation

## 2017-01-04 NOTE — Therapy (Signed)
[place], [place], [address] Phone: [phone]   Fax:  [phone]  Occupational Therapy Evaluation  Patient Details  Name: Teresa Anderson MRN: [id number] Date of Birth: May 06, 1941 Referring Provider: Dr. Corinna Capra  Encounter Date: 01/04/2017      OT End of Session - 01/04/17 1500    Visit Number 1   Number of Visits 8   Date for OT Re-Evaluation 03/05/17   Authorization Type [employer] Medicare   Authorization Time Period before 10th visit   Authorization - Visit Number 1   Authorization - Number of Visits 10   OT Start Time 1310   OT Stop Time 1350   OT Time Calculation (min) 40 min   Activity Tolerance Patient tolerated treatment well   Behavior During Therapy [facility] for tasks assessed/performed      Past Medical History:  Diagnosis Date  . Allergy   . Arthritis   . Asthma   . Blood transfusion without reported diagnosis    with cancer surgery  . Cancer ([place])    breast and skin  . Diabetes mellitus without complication ([place])   . Hyperlipidemia   . Hypertension   . Lymphedema of leg   . Thyroid disease   . Varicose veins of bilateral lower extremities with other complications     Past Surgical History:  Procedure Laterality Date  . ABDOMINAL HYSTERECTOMY    . BREAST SURGERY     bilat  . CESAREAN SECTION     x4  . CHOLECYSTECTOMY    . COSMETIC SURGERY     reconstruction  . MASTECTOMY     bilateral    There were no vitals filed for this visit.      Subjective Assessment - 01/04/17 1457    Subjective  S:  My shoulder has hurt on and off for years, however, it has remained constant for the past 4 months.   Pertinent History Ms. Colon reports increased pain, decreased mobility in her left shoulder for several years.  She has consulted with Dr. Greta Doom and has been referred to occupational therapy for evaluation and treatment.  She has also consulted Dr. Luna Glasgow regarding her shoulder pain.   Dr. Luna Glasgow is ordering an MRI.     Special Tests FOTO:  4   Currently in Pain? Yes   Pain Score 10-Worst pain ever   Pain Location Shoulder   Pain Orientation Left   Pain Descriptors / Indicators Aching   Pain Type Chronic pain   Pain Radiating Towards up neck and down her arm   Pain Onset More than a month ago   Pain Frequency Constant   Aggravating Factors  everything   Pain Relieving Factors nothing   Effect of Pain on Daily Activities severe           [facility] OT Assessment - 01/04/17 0001      Assessment   Diagnosis Left Shoulder Bursitis   Referring Provider Dr. Corinna Capra   Onset Date --  chronic   Prior Therapy n/a     Precautions   Precautions None     Restrictions   Weight Bearing Restrictions No     Balance Screen   Has the patient fallen in the past 6 months No   Has the patient had a decrease in activity level because of a fear of falling?  No   Is the patient reluctant to leave their home because of a fear of falling?  No  Home  Environment   Family/patient expects to be discharged to: Private residence   Lives With Alone     Prior Function   Level of [place] Retired   Leisure sewing, Actuary cards, cook      ADL   ADL comments unable to use her left arm above waist height due to shoulder pain, unable to style hair, tie her apron, pull up her pants, lifting her pocketbook is painful       Written Expression   Dominant Hand Right     Vision - History   Baseline Vision Wears glasses all the time     Cognition   Overall Cognitive Status Within Functional Limits for tasks assessed     Observation/Other Assessments   Focus on Therapeutic Outcomes (FOTO)  4/100     Sensation   Light Touch Appears Intact     Coordination   Gross Motor Movements are Fluid and Coordinated Yes   Fine Motor Movements are Fluid and Coordinated Yes     ROM / Strength   AROM / PROM / Strength AROM;PROM;Strength     Palpation    Palpation comment max fascial restrictions noted in left upper arm, scapular, and shoulder region     AROM   Overall AROM Comments assessed in supine, external and internal rotation with shoulder adducted   AROM Assessment Site Shoulder   Right/Left Shoulder Left   Left Shoulder Flexion 90 Degrees   Left Shoulder ABduction 80 Degrees   Left Shoulder Internal Rotation 90 Degrees   Left Shoulder External Rotation 0 Degrees     PROM   PROM Assessment Site Shoulder   Right/Left Shoulder Left   Left Shoulder Flexion 120 Degrees   Left Shoulder ABduction 85 Degrees   Left Shoulder Internal Rotation 90 Degrees   Left Shoulder External Rotation 0 Degrees     Strength   Overall Strength Comments strength not tested due to pain level   Strength Assessment Site Shoulder   Right/Left Shoulder Left                  OT Treatments/Exercises (OP) - 01/04/17 0001      Manual Therapy   Manual Therapy Myofascial release   Manual therapy comments manual therapy completed seperately from all other interventions this date   Myofascial Release myofascial release and manual stretching to left upper arm, scapular, and shoulder region to decrease pain and improve pain free mobility in her left shoulder region.                 OT Education - 01/04/17 1500    Education provided Yes   Education Details educated on HEP for shoulder towel slides into flexion, abduction, rotation.  30-60 seconds 2-3 times per day   Person(s) Educated Patient   Methods Explanation;Handout   Comprehension Verbalized understanding          OT Short Term Goals - 01/04/17 1505      OT SHORT TERM GOAL #1   Title Patient will be educated on and independent with an HEP for improvedl eft shoulder functional mobility.   Time 4   Period Weeks   Status New     OT SHORT TERM GOAL #2   Title Patient will improve left shoulder P/ROM to [facility] in order to improve ease with donning and doffing clothing.     Time 4   Period Weeks   Status New     OT SHORT  TERM GOAL #3   Title Patient will have 3+/5 strength or better in left arm in order to carry her pocketbook with less pain.   Time 4   Period Weeks   Status New     OT SHORT TERM GOAL #4   Title Patient will decrease pain in her left shoulder to 6/10 or better during functional activities.    Time 4   Period Weeks   Status New     OT SHORT TERM GOAL #5   Title Patient will decrease fascial restrictions to mod-max in her left shoulder region.    Time 4   Period Weeks   Status New           OT Long Term Goals - 01/04/17 1507      OT LONG TERM GOAL #1   Title Patient will return to her prior level of function with all B/IADLS and leisure activities.    Time 8   Period Weeks   Status New     OT LONG TERM GOAL #2   Title Patient will have WFL A/ROM in her left shoulder for increased ability to fix her hair.    Time 8   Period Weeks   Status New     OT LONG TERM GOAL #3   Title Patient will have 4/5 strength or better in her left shoulder region in order to lift bags of dog food.    Time 8   Period Weeks   Status New     OT LONG TERM GOAL #4   Title Patient will have 4/10 pain or better in her left shoulder when completing daily activities and lying down.    Time 8   Period Weeks   Status New     OT LONG TERM GOAL #5   Title Patient will have min-mod fascial restrictions in her left shoulder for greater mobility and ease with ADLs.   Time 8   Period Weeks   Status New               Plan - 01/04/17 1501    Clinical Impression Statement A:  Patient is a 76 year old female with multiple medical problems.  Patient has been experiencing pain in her left shoulder  with resulting decreased mobility for several years.  Her pain has intensified in the last 4 months.  She has not had an MRI, x rays were negative.     Occupational Profile and client history currently impacting functional performance Patient states  that pain and limited mobility in her non dominant shoulder affect her ability to complete BADLS, IADLS, leisure activities, and sleeping is painful and difficult.   Occupational performance deficits (Please refer to evaluation for details): ADL's;IADL's;Rest and Sleep;Play;Leisure;Social Participation   Rehab Potential Fair   Current Impairments/barriers affecting progress: chronic pain   OT Frequency 1x / week   OT Duration 8 weeks   OT Treatment/Interventions Self-care/ADL training;Cryotherapy;Moist Heat;Electrical Stimulation;DME and/or AE instruction;Energy conservation;Neuromuscular education;Therapeutic exercise;Manual Therapy;Passive range of motion;Therapeutic activities;Therapeutic exercises;Patient/family education   Plan P: SKilled OT intervention is indicated to decrease pain and restrictions and improve pain free mobility in her left shoulder in order to return to PLOF with all B/IADLS, leisure activities.    Clinical Decision Making Limited treatment options, no task modification necessary   Consulted and Agree with Plan of Care Patient      Patient will benefit from skilled therapeutic intervention in order to improve the following deficits and impairments:  Decreased range of motion, Decreased mobility, Decreased strength, Impaired perceived functional ability, Increased fascial restricitons, Impaired UE functional use, Pain, Obesity  Visit Diagnosis: Chronic left shoulder pain - Plan: Ot plan of care cert/re-cert  Stiffness of left shoulder, not elsewhere classified - Plan: Ot plan of care cert/re-cert  Other symptoms and signs involving the musculoskeletal system - Plan: Ot plan of care cert/re-cert      G-Codes - 62/86/38 1511    Functional Assessment Tool Used (Outpatient only) FOTO 4/100:  96% limitied   Functional Limitation Self care   Self Care Current Status (T7711) At least 80 percent but less than 100 percent impaired, limited or restricted   Self Care Goal  Status (A5790) At least 40 percent but less than 60 percent impaired, limited or restricted      Problem List Patient Active Problem List   Diagnosis Date Noted  . Abdominal hernia without obstruction or gangrene 10/12/2016  . Acute pain of left shoulder 10/08/2016  . Varicose veins of bilateral lower extremities with other complications 38/33/3832  . Essential hypertension 03/16/2016  . Lymphedema 03/16/2016  . Breast cancer in female [facility]) 03/16/2016  . Hypothyroidism 03/16/2016  . 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 Prediabetes 03/16/2016  . Obesity 03/16/2016  . Hyperlipidemia 03/16/2016  . Chronic pain syndrome 03/16/2016    Vangie Bicker, [place], OTR/L [phone]  01/04/2017, 3:20 PM  [facility] [address] [place], [place], [address] Phone: [phone]   Fax:  [phone]  Name: Nahia Nissan MRN: [id number] Date of Birth: August 20, 1940

## 2017-01-04 NOTE — Patient Instructions (Signed)
SHOULDER: Flexion On Table   Place hands on table, elbows straight. Move hips away from body. Press hands down into table. Hold ___ seconds. ___ reps per set, ___ sets per day, ___ days per week  Abduction (Passive)   With arm out to side, resting on table, lower head toward arm, keeping trunk away from table. Hold ____ seconds. Repeat ____ times. Do ____ sessions per day.  Copyright  VHI. All rights reserved.     Internal Rotation (Assistive)   Seated with elbow bent at right angle and held against side, slide arm on table surface in an inward arc. Repeat ____ times. Do ____ sessions per day. Activity: Use this motion to brush crumbs off the table.  Copyright  VHI. All rights reserved.   

## 2017-01-05 ENCOUNTER — Ambulatory Visit: Payer: Medicare Other | Admitting: Family Medicine

## 2017-01-12 ENCOUNTER — Other Ambulatory Visit (HOSPITAL_BASED_OUTPATIENT_CLINIC_OR_DEPARTMENT_OTHER): Payer: Self-pay

## 2017-01-12 DIAGNOSIS — R5383 Other fatigue: Secondary | ICD-10-CM

## 2017-01-12 DIAGNOSIS — G4710 Hypersomnia, unspecified: Secondary | ICD-10-CM

## 2017-01-12 DIAGNOSIS — R0683 Snoring: Secondary | ICD-10-CM

## 2017-01-13 ENCOUNTER — Ambulatory Visit (HOSPITAL_COMMUNITY): Payer: Medicare Other | Admitting: Specialist

## 2017-01-13 DIAGNOSIS — G8929 Other chronic pain: Secondary | ICD-10-CM | POA: Diagnosis not present

## 2017-01-13 DIAGNOSIS — R29898 Other symptoms and signs involving the musculoskeletal system: Secondary | ICD-10-CM | POA: Diagnosis not present

## 2017-01-13 DIAGNOSIS — M25612 Stiffness of left shoulder, not elsewhere classified: Secondary | ICD-10-CM

## 2017-01-13 DIAGNOSIS — M25512 Pain in left shoulder: Principal | ICD-10-CM

## 2017-01-13 NOTE — Therapy (Signed)
Teresa Anderson, [place], [address] Phone: [phone]   Fax:  [phone]  Occupational Therapy Treatment  Patient Details  Name: Teresa Anderson MRN: [id number] Date of Birth: 07/31/1940 Referring Provider: Dr. Corinna Capra  Encounter Date: 01/13/2017      OT End of Session - 01/13/17 1358    Visit Number 2   Number of Visits 8   Date for OT Re-Evaluation 03/05/17  mini reassess on 02/02/17   Authorization Type [employer]   Authorization Time Period before 10th visit   Authorization - Visit Number 2   Authorization - Number of Visits 10   OT Start Time 1121   OT Stop Time 1200   OT Time Calculation (min) 39 min   Activity Tolerance Patient tolerated treatment well   Behavior During Therapy [facility] for tasks assessed/performed      Past Medical History:  Diagnosis Date  . Allergy   . Arthritis   . Asthma   . Blood transfusion without reported diagnosis    with cancer surgery  . Cancer ([place])    breast and skin  . Diabetes mellitus without complication ([place])   . Hyperlipidemia   . Hypertension   . Lymphedema of leg   . Thyroid disease   . Varicose veins of bilateral lower extremities with other complications     Past Surgical History:  Procedure Laterality Date  . ABDOMINAL HYSTERECTOMY    . BREAST SURGERY     bilat  . CESAREAN SECTION     x4  . CHOLECYSTECTOMY    . COSMETIC SURGERY     reconstruction  . MASTECTOMY     bilateral    There were no vitals filed for this visit.      Subjective Assessment - 01/13/17 1353    Subjective  S:  When I do the exercises, I have alot of pain the next day.  So, I havent been doing the exercises.   Currently in Pain? Yes   Pain Score 9    Pain Location Shoulder   Pain Orientation Left   Pain Descriptors / Indicators Aching   Pain Type Chronic pain   Pain Onset More than a month ago   Pain Frequency Constant            [facility] OT Assessment -  01/13/17 0001      Assessment   Diagnosis Left Shoulder Bursitis     Precautions   Precautions None     Restrictions   Weight Bearing Restrictions No                  OT Treatments/Exercises (OP) - 01/13/17 0001      Exercises   Exercises Shoulder     Shoulder Exercises: Seated   Elevation AROM;10 reps   Extension AROM;10 reps   Row AROM;10 reps   External Rotation PROM;5 reps   Internal Rotation PROM;5 reps   Flexion PROM;5 reps   Abduction PROM;5 reps     Shoulder Exercises: Isometric Strengthening   Flexion 3X3"   Extension 3X3"   External Rotation 3X3"   Internal Rotation 3X3"   ABduction 3X3"   ADduction 3X3"     Shoulder Exercises: Stretch   Table Stretch - Flexion --  20 repetitions   Table Stretch - Abduction --  20 repetitions     Manual Therapy   Manual Therapy Myofascial release   Manual therapy comments manual therapy completed seperately from all other  interventions this date   Myofascial Release myofascial release completed in seated and manual stretching to left upper arm, scapular, and shoulder region to decrease pain and improve pain free mobility in her left shoulder region.                  OT Education - 01/13/17 1355    Education provided Yes   Education Details reviewed plan of care and towel slides    Person(s) Educated Patient   Methods Explanation;Handout   Comprehension Verbalized understanding;Returned demonstration          OT Short Term Goals - 01/13/17 1400      OT SHORT TERM GOAL #1   Title Patient will be educated on and independent with an HEP for improvedl eft shoulder functional mobility.   Time 4   Period Weeks   Status On-going     OT SHORT TERM GOAL #2   Title Patient will improve left shoulder P/ROM to [facility] in order to improve ease with donning and doffing clothing.    Time 4   Period Weeks   Status On-going     OT SHORT TERM GOAL #3   Title Patient will have 3+/5 strength or better in  left arm in order to carry her pocketbook with less pain.   Time 4   Period Weeks   Status On-going     OT SHORT TERM GOAL #4   Title Patient will decrease pain in her left shoulder to 6/10 or better during functional activities.    Time 4   Period Weeks   Status On-going     OT SHORT TERM GOAL #5   Title Patient will decrease fascial restrictions to mod-max in her left shoulder region.    Time 4   Period Weeks   Status On-going           OT Long Term Goals - 01/13/17 1400      OT LONG TERM GOAL #1   Title Patient will return to her prior level of function with all B/IADLS and leisure activities.    Time 8   Period Weeks   Status On-going     OT LONG TERM GOAL #2   Title Patient will have WFL A/ROM in her left shoulder for increased ability to fix her hair.    Time 8   Period Weeks   Status On-going     OT LONG TERM GOAL #3   Title Patient will have 4/5 strength or better in her left shoulder region in order to lift bags of dog food.    Time 8   Period Weeks   Status On-going     OT LONG TERM GOAL #4   Title Patient will have 4/10 pain or better in her left shoulder when completing daily activities and lying down.    Time 8   Period Weeks   Status On-going     OT LONG TERM GOAL #5   Title Patient will have min-mod fascial restrictions in her left shoulder for greater mobility and ease with ADLs.   Time 8   Period Weeks   Status On-going               Plan - 01/13/17 1358    Clinical Impression Statement A:  Patient educated on effects of lack of movement causing increased pain, increased pain causing lack of movement.  HEP may be causing pain because it is breaking the cycle and increasing movement.  Patient  voiced understanding and stated she would be more compliant with HEP.   Plan P:  Improve P/ROM to [facility] for improved use of left arm with functional activities.  begin ball stretches.       Patient will benefit from skilled therapeutic  intervention in order to improve the following deficits and impairments:  Decreased range of motion, Decreased mobility, Decreased strength, Impaired perceived functional ability, Increased fascial restricitons, Impaired UE functional use, Pain, Obesity  Visit Diagnosis: Chronic left shoulder pain  Stiffness of left shoulder, not elsewhere classified  Other symptoms and signs involving the musculoskeletal system    Problem List Patient Active Problem List   Diagnosis Date Noted  . Abdominal hernia without obstruction or gangrene 10/12/2016  . Acute pain of left shoulder 10/08/2016  . Varicose veins of bilateral lower extremities with other complications 91/69/4503  . Essential hypertension 03/16/2016  . Lymphedema 03/16/2016  . Breast cancer in female [facility]) 03/16/2016  . Hypothyroidism 03/16/2016  . 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 Prediabetes 03/16/2016  . Obesity 03/16/2016  . Hyperlipidemia 03/16/2016  . Chronic pain syndrome 03/16/2016    Vangie Bicker, [place], OTR/L [phone]  01/13/2017, 2:04 PM  [place], [place], [address] Phone: [phone]   Fax:  [phone]  Name: Heleena Miceli MRN: [id number] Date of Birth: January 31, 1941

## 2017-01-14 ENCOUNTER — Encounter: Payer: Self-pay | Admitting: Family Medicine

## 2017-01-14 ENCOUNTER — Ambulatory Visit (INDEPENDENT_AMBULATORY_CARE_PROVIDER_SITE_OTHER): Payer: Medicare Other | Admitting: Family Medicine

## 2017-01-14 VITALS — BP 128/76 | HR 60 | Temp 97.4°F | Resp 16 | Ht 64.0 in | Wt 212.1 lb

## 2017-01-14 DIAGNOSIS — R1031 Right lower quadrant pain: Secondary | ICD-10-CM | POA: Diagnosis not present

## 2017-01-14 DIAGNOSIS — E785 Hyperlipidemia, unspecified: Secondary | ICD-10-CM

## 2017-01-14 DIAGNOSIS — R1011 Right upper quadrant pain: Secondary | ICD-10-CM

## 2017-01-14 DIAGNOSIS — R7303 Prediabetes: Secondary | ICD-10-CM | POA: Diagnosis not present

## 2017-01-14 DIAGNOSIS — G894 Chronic pain syndrome: Secondary | ICD-10-CM

## 2017-01-14 MED ORDER — IBUPROFEN 800 MG PO TABS: ORAL_TABLET | ORAL | 5 refills | Status: DC

## 2017-01-14 NOTE — Progress Notes (Signed)
Chief Complaint  Patient presents with  . Abdominal Pain   Still complains of abdominal asymetry and pain.  Pain is superficial in the pannus to the R.  Feels hard.  Hurts to move.  Better when lies down Pain management not yet optimal Is attending PT Discussed labs in June High lipids, refuses statin High A1c, needs to reduce carbohydrates and sweets   Patient Active Problem List   Diagnosis Date Noted  . Abdominal hernia without obstruction or gangrene 10/12/2016  . Acute pain of left shoulder 10/08/2016  . Varicose veins of bilateral lower extremities with other complications 27/08/5007  . Essential hypertension 03/16/2016  . Lymphedema 03/16/2016  . Breast cancer in female [facility]) 03/16/2016  . Hypothyroidism 03/16/2016  . 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 Prediabetes 03/16/2016  . Obesity 03/16/2016  . Hyperlipidemia 03/16/2016  . Chronic pain syndrome 03/16/2016    Outpatient Encounter Prescriptions as of 01/14/2017  Medication Sig  . atenolol (TENORMIN) 50 MG tablet Take 1 tablet (50 mg total) by mouth daily.  . buprenorphine (BUTRANS - DOSED MCG/HR) 5 MCG/HR PTWK patch Place 5 mcg onto the skin once a week.  . cholecalciferol (VITAMIN D) 1000 units tablet Take 1,000 Units by mouth daily.  Marland Kitchen gabapentin (NEURONTIN) 300 MG capsule Take 1 capsule (300 mg total) by mouth 3 (three) times daily.  . hydrochlorothiazide (HYDRODIURIL) 25 MG tablet Take 1 tablet (25 mg total) by mouth daily.  Marland Kitchen ibuprofen (ADVIL,MOTRIN) 800 MG tablet TAKE (1) TABLET BY MOUTH THREE TIMES DAILY AS NEEDED.  Marland Kitchen levothyroxine (SYNTHROID) 175 MCG tablet Take 1 tablet (175 mcg total) by mouth daily before breakfast.  . Oxycodone HCl 20 MG TABS Take 1 tablet (20 mg total) by mouth every 4 (four) hours as needed. (Patient taking differently: Take 10 mg by mouth 5 (five) times daily as needed. )  . [DISCONTINUED] ibuprofen  (ADVIL,MOTRIN) 800 MG tablet TAKE (1) TABLET BY MOUTH THREE TIMES DAILY AS NEEDED.   No facility-administered encounter medications on file as of 01/14/2017.     Allergies  Allergen Reactions  . Penicillins Anaphylaxis    Childhood allergy-unknown  . Sulfa Antibiotics Swelling    angioedema  . Demerol [Meperidine] Other (See Comments)    anxiety  . Morphine And Related Itching    side effect     Review of Systems  Constitutional: Negative for activity change, appetite change, chills and fever.  Respiratory: Negative for cough and shortness of breath.   Cardiovascular: Negative for chest pain and palpitations.  Gastrointestinal: Positive for abdominal distention and abdominal pain. Negative for constipation and diarrhea.  Genitourinary: Negative for difficulty urinating and frequency.  Musculoskeletal: Positive for arthralgias, back pain and gait problem.       Chronic pain  Skin: Negative for color change and wound.  Psychiatric/Behavioral: Positive for dysphoric mood and sleep disturbance.  All other systems reviewed and are negative.   BP 128/76 (BP Location: Left Arm, Patient Position: Sitting, Cuff Size: Normal)   Pulse 60   Temp (!) 97.4 F (36.3 C) (Temporal)   Resp 16   Ht 5\' 4"  (1.626 m)   Wt 212 lb 1.3 oz (96.2 kg)   SpO2 97%   BMI 36.40 kg/m   Physical Exam  Constitutional: She is oriented to person, place, and time. She appears well-developed and well-nourished.  HENT:  Head: Normocephalic and atraumatic.  Mouth/Throat: Oropharynx is clear and moist.  Eyes: Pupils are equal, round, and reactive to light. Conjunctivae are normal.  Cardiovascular: Normal rate and regular rhythm.   Murmur heard. Pulmonary/Chest: Effort normal and breath sounds normal. No respiratory distress.  Abdominal: Soft. Bowel sounds are normal. She exhibits distension. There is tenderness.  Bulge asymmetric to R, firm and tender  Musculoskeletal:  Very limited ROM left shoulder    Neurological: She is alert and oriented to person, place, and time.  Psychiatric: She has a normal mood and affect. Her behavior is normal.    ASSESSMENT/PLAN:  1. Chronic pain syndrome Sees pain mgt - ibuprofen (ADVIL,MOTRIN) 800 MG tablet; TAKE (1) TABLET BY MOUTH THREE TIMES DAILY AS NEEDED.  Dispense: 90 tablet; Refill: 5  2. Prediabetes Diet discussed  3. Hyperlipidemia, unspecified hyperlipidemia type Again diet  4. Right lower quadrant abdominal pain  5. Abdominal wall pain in right upper quadrant Patient feels might be hernia - Ambulatory referral to General Surgery   Patient Instructions  I am referring you to Dr Arnoldo Morale to be evaluated for the abdominal bulge and pain Continue current medicines Stay as active as you can manage Limit sweets and carbohydrates  See me in 3 months Call sooner for problems      Raylene Everts, MD

## 2017-01-14 NOTE — Patient Instructions (Signed)
I am referring you to Dr Arnoldo Morale to be evaluated for the abdominal bulge and pain Continue current medicines Stay as active as you can manage Limit sweets and carbohydrates  See me in 3 months Call sooner for problems

## 2017-01-15 DIAGNOSIS — M47817 Spondylosis without myelopathy or radiculopathy, lumbosacral region: Secondary | ICD-10-CM | POA: Diagnosis not present

## 2017-01-15 DIAGNOSIS — M7552 Bursitis of left shoulder: Secondary | ICD-10-CM | POA: Diagnosis not present

## 2017-01-15 DIAGNOSIS — G894 Chronic pain syndrome: Secondary | ICD-10-CM | POA: Diagnosis not present

## 2017-01-15 DIAGNOSIS — M19012 Primary osteoarthritis, left shoulder: Secondary | ICD-10-CM | POA: Diagnosis not present

## 2017-01-18 ENCOUNTER — Ambulatory Visit (HOSPITAL_COMMUNITY): Payer: Medicare Other | Admitting: Specialist

## 2017-01-19 ENCOUNTER — Telehealth (HOSPITAL_COMMUNITY): Payer: Self-pay | Admitting: Family Medicine

## 2017-01-19 NOTE — Telephone Encounter (Signed)
01/19/17  she called after she got the automated call and said she would need to RS this appt.  I explained that I could add on to the end of her schedule

## 2017-01-20 ENCOUNTER — Ambulatory Visit (HOSPITAL_COMMUNITY): Payer: Medicare Other | Admitting: Specialist

## 2017-01-20 ENCOUNTER — Encounter (HOSPITAL_COMMUNITY): Payer: Medicare Other

## 2017-01-22 ENCOUNTER — Ambulatory Visit: Payer: Medicare Other | Admitting: Family Medicine

## 2017-01-25 DIAGNOSIS — M47817 Spondylosis without myelopathy or radiculopathy, lumbosacral region: Secondary | ICD-10-CM | POA: Diagnosis not present

## 2017-01-25 DIAGNOSIS — G894 Chronic pain syndrome: Secondary | ICD-10-CM | POA: Diagnosis not present

## 2017-01-25 DIAGNOSIS — M19012 Primary osteoarthritis, left shoulder: Secondary | ICD-10-CM | POA: Diagnosis not present

## 2017-01-25 DIAGNOSIS — M7552 Bursitis of left shoulder: Secondary | ICD-10-CM | POA: Diagnosis not present

## 2017-01-26 ENCOUNTER — Ambulatory Visit (HOSPITAL_COMMUNITY): Payer: Medicare Other

## 2017-01-27 ENCOUNTER — Encounter (HOSPITAL_COMMUNITY): Payer: Medicare Other | Admitting: Specialist

## 2017-01-28 ENCOUNTER — Other Ambulatory Visit: Payer: Self-pay | Admitting: Physical Medicine and Rehabilitation

## 2017-01-28 DIAGNOSIS — M47817 Spondylosis without myelopathy or radiculopathy, lumbosacral region: Secondary | ICD-10-CM

## 2017-02-02 ENCOUNTER — Encounter (HOSPITAL_COMMUNITY): Payer: Medicare Other

## 2017-02-03 ENCOUNTER — Encounter (HOSPITAL_COMMUNITY): Payer: Medicare Other

## 2017-02-03 ENCOUNTER — Ambulatory Visit (HOSPITAL_COMMUNITY): Payer: Medicare Other

## 2017-02-03 ENCOUNTER — Telehealth (HOSPITAL_COMMUNITY): Payer: Self-pay | Admitting: Student

## 2017-02-03 NOTE — Telephone Encounter (Signed)
Called pt regarding missed appointment. Left message on answering machine, however was unaware of patient not wanting messages left until after the phone call was made. Informed pt of next appointment date.  Luther Hearing, OT Student [phone]

## 2017-02-08 ENCOUNTER — Encounter (HOSPITAL_COMMUNITY): Payer: Medicare Other

## 2017-02-10 ENCOUNTER — Telehealth (HOSPITAL_COMMUNITY): Payer: Self-pay | Admitting: Family Medicine

## 2017-02-10 ENCOUNTER — Ambulatory Visit (HOSPITAL_COMMUNITY): Payer: Medicare Other

## 2017-02-10 DIAGNOSIS — L718 Other rosacea: Secondary | ICD-10-CM | POA: Diagnosis not present

## 2017-02-10 DIAGNOSIS — Z1283 Encounter for screening for malignant neoplasm of skin: Secondary | ICD-10-CM | POA: Diagnosis not present

## 2017-02-10 DIAGNOSIS — D225 Melanocytic nevi of trunk: Secondary | ICD-10-CM | POA: Diagnosis not present

## 2017-02-10 NOTE — Telephone Encounter (Signed)
02/10/17  Patient cx via phone tree

## 2017-02-15 DIAGNOSIS — M7552 Bursitis of left shoulder: Secondary | ICD-10-CM | POA: Diagnosis not present

## 2017-02-15 DIAGNOSIS — M47817 Spondylosis without myelopathy or radiculopathy, lumbosacral region: Secondary | ICD-10-CM | POA: Diagnosis not present

## 2017-02-15 DIAGNOSIS — M19012 Primary osteoarthritis, left shoulder: Secondary | ICD-10-CM | POA: Diagnosis not present

## 2017-02-15 DIAGNOSIS — G894 Chronic pain syndrome: Secondary | ICD-10-CM | POA: Diagnosis not present

## 2017-02-16 ENCOUNTER — Ambulatory Visit (INDEPENDENT_AMBULATORY_CARE_PROVIDER_SITE_OTHER): Payer: Medicare Other | Admitting: General Surgery

## 2017-02-16 ENCOUNTER — Encounter: Payer: Self-pay | Admitting: General Surgery

## 2017-02-16 VITALS — BP 164/71 | HR 59 | Temp 98.7°F | Resp 18 | Ht 64.0 in | Wt 217.0 lb

## 2017-02-16 DIAGNOSIS — R1031 Right lower quadrant pain: Secondary | ICD-10-CM

## 2017-02-16 NOTE — Progress Notes (Signed)
Teresa Anderson; [id number]; 1941/06/28   HPI Patient is a 76 year old white female who is referred to my care by Dr. Meda Coffee for evaluation and treatment of abdominal wall pain. Patient states that the swelling and pain have been present since April of this year. She states she had an arthro-per arthropod bite and the pain continued after that. The pain radiates around the right flank to the back. It is worse in the morning when she wakes up. She states she has an 8 out of 10 pain. She denies any fever, chills, nausea, vomiting. She also suffers from back pain which radiates down the right leg. She is concerned that she may have a hernia. She did have a CT scan of the abdomen done in April. She has had abdominal surgeries in the past. Past Medical History:  Diagnosis Date  . Allergy   . Arthritis   . Asthma   . Blood transfusion without reported diagnosis    with cancer surgery  . Cancer ([place])    breast and skin  . Diabetes mellitus without complication ([place])   . Hyperlipidemia   . Hypertension   . Lymphedema of leg   . Thyroid disease   . Varicose veins of bilateral lower extremities with other complications     Past Surgical History:  Procedure Laterality Date  . ABDOMINAL HYSTERECTOMY    . BREAST SURGERY     bilat  . CESAREAN SECTION     x4  . CHOLECYSTECTOMY    . COSMETIC SURGERY     reconstruction  . MASTECTOMY     bilateral    Family History  Problem Relation Age of Onset  . Heart disease Mother 87       heart attack  . COPD Father        former smoker  . Pancreatic cancer Father   . Celiac disease Daughter   . Cancer Maternal Aunt        lung-smoker    Current Outpatient Prescriptions on File Prior to Visit  Medication Sig Dispense Refill  . atenolol (TENORMIN) 50 MG tablet Take 1 tablet (50 mg total) by mouth daily. 90 tablet 3  . buprenorphine (BUTRANS - DOSED MCG/HR) 5 MCG/HR PTWK patch Place 5 mcg onto the skin once a week.    . cholecalciferol (VITAMIN  D) 1000 units tablet Take 1,000 Units by mouth daily.    Marland Kitchen gabapentin (NEURONTIN) 300 MG capsule Take 1 capsule (300 mg total) by mouth 3 (three) times daily. 90 capsule 1  . hydrochlorothiazide (HYDRODIURIL) 25 MG tablet Take 1 tablet (25 mg total) by mouth daily. 90 tablet 3  . ibuprofen (ADVIL,MOTRIN) 800 MG tablet TAKE (1) TABLET BY MOUTH THREE TIMES DAILY AS NEEDED. 90 tablet 5  . levothyroxine (SYNTHROID) 175 MCG tablet Take 1 tablet (175 mcg total) by mouth daily before breakfast. 90 tablet 3  . Oxycodone HCl 20 MG TABS Take 1 tablet (20 mg total) by mouth every 4 (four) hours as needed. (Patient taking differently: Take 10 mg by mouth 5 (five) times daily as needed. ) 108 tablet 0   No current facility-administered medications on file prior to visit.     Allergies  Allergen Reactions  . Penicillins Anaphylaxis    Childhood allergy-unknown  . Sulfa Antibiotics Swelling    angioedema  . Demerol [Meperidine] Other (See Comments)    anxiety  . Morphine And Related Itching    side effect     History  Alcohol Use No  History  Smoking Status  . Never Smoker  Smokeless Tobacco  . Never Used    Review of Systems  Constitutional: Negative.   HENT: Negative.   Eyes: Negative.   Respiratory: Negative.   Cardiovascular: Negative.   Gastrointestinal: Negative.   Genitourinary: Negative.   Musculoskeletal: Positive for back pain.  Skin: Negative.   Neurological: Negative.   Endo/Heme/Allergies: Negative.   Psychiatric/Behavioral: Negative.     Objective   Vitals:   02/16/17 1137  BP: (!) 164/71  Pulse: (!) 59  Resp: 18  Temp: 98.7 F (37.1 C)    Physical Exam  Constitutional: She is oriented to person, place, and time and well-developed, well-nourished, and in no distress.  HENT:  Head: Normocephalic and atraumatic.  Cardiovascular: Normal rate and regular rhythm.  Exam reveals no gallop and no friction rub.   No murmur heard. Pulmonary/Chest: Effort  normal and breath sounds normal. No respiratory distress. She has no wheezes. She has no rales.  Abdominal: Soft. Bowel sounds are normal. She exhibits no distension. There is no tenderness. There is no rebound and no guarding.  Large pannus is present. She does have abdominal wall laxity, but no specific hernia could be identified.  Musculoskeletal: She exhibits tenderness.  She does have tenderness to palpation of her lower back.  Neurological: She is alert and oriented to person, place, and time.  Skin: Skin is warm and dry.  Vitals reviewed.  CT scan images personally reviewed from April of this year. Assessment  Abdominal pain. Patient also appears to have some chronic lower back pain. She does have laxity of the abdominal wall, but no hernia is identified. Plan   I told the patient that I could not identify a specific hernia. Thus, no surgical intervention is warranted at this time from the general surgery standpoint. I did suggest that she may need further back workup as she does have back pain and states that when her abdominal pain radiates to the back, it does radiate down the right leg. She understands this and agrees. Follow-up as needed.

## 2017-02-17 ENCOUNTER — Ambulatory Visit (HOSPITAL_COMMUNITY): Payer: Medicare Other | Attending: Family Medicine

## 2017-02-17 ENCOUNTER — Encounter (HOSPITAL_COMMUNITY): Payer: Self-pay

## 2017-02-17 DIAGNOSIS — M25512 Pain in left shoulder: Secondary | ICD-10-CM | POA: Diagnosis not present

## 2017-02-17 DIAGNOSIS — G8929 Other chronic pain: Secondary | ICD-10-CM | POA: Diagnosis not present

## 2017-02-17 DIAGNOSIS — M25612 Stiffness of left shoulder, not elsewhere classified: Secondary | ICD-10-CM | POA: Diagnosis not present

## 2017-02-17 DIAGNOSIS — R29898 Other symptoms and signs involving the musculoskeletal system: Secondary | ICD-10-CM | POA: Insufficient documentation

## 2017-02-17 NOTE — Therapy (Signed)
[place], [place], [address] Phone: [phone]   Fax:  [phone]  Occupational Therapy Treatment  Patient Details  Name: Teresa Anderson MRN: [id number] Date of Birth: Oct 29, 1940 Referring Provider: Dr. Corinna Capra  Encounter Date: 02/17/2017      OT End of Session - 02/17/17 1307    Visit Number 3   Number of Visits 8   Date for OT Re-Evaluation 03/05/17   Authorization Type U[employer] Medicare   Authorization Time Period before 10th visit   Authorization - Visit Number 3   Authorization - Number of Visits 10   OT Start Time 1125   OT Stop Time 1200   OT Time Calculation (min) 35 min   Activity Tolerance Patient tolerated treatment well   Behavior During Therapy [facility] for tasks assessed/performed      Past Medical History:  Diagnosis Date  . Allergy   . Arthritis   . Asthma   . Blood transfusion without reported diagnosis    with cancer surgery  . Cancer ([place])    breast and skin  . Diabetes mellitus without complication ([place])   . Hyperlipidemia   . Hypertension   . Lymphedema of leg   . Thyroid disease   . Varicose veins of bilateral lower extremities with other complications     Past Surgical History:  Procedure Laterality Date  . ABDOMINAL HYSTERECTOMY    . BREAST SURGERY     bilat  . CESAREAN SECTION     x4  . CHOLECYSTECTOMY    . COSMETIC SURGERY     reconstruction  . MASTECTOMY     bilateral    There were no vitals filed for this visit.      Subjective Assessment - 02/17/17 1306    Subjective  S: It hasn't moved this much in a long time.   Currently in Pain? Yes   Pain Score 8    Pain Location Shoulder   Pain Orientation Left   Pain Descriptors / Indicators Aching   Pain Type Chronic pain   Pain Radiating Towards up neck and down arm   Pain Onset More than a month ago   Pain Frequency Constant   Aggravating Factors  everyhing (movement)   Pain Relieving Factors nothing   Effect of Pain on Daily Activities severe   Multiple Pain Sites No            [facility] OT Assessment - 02/17/17 1305      Assessment   Diagnosis Left Shoulder Bursitis     Precautions   Precautions None                  OT Treatments/Exercises (OP) - 02/17/17 1305      Exercises   Exercises Shoulder     Shoulder Exercises: Seated   Extension AROM;10 reps   Row AROM;10 reps   Protraction PROM;10 reps   Horizontal ABduction PROM;10 reps   External Rotation PROM;10 reps   Internal Rotation PROM;10 reps   Flexion PROM;10 reps   Abduction PROM;10 reps     Shoulder Exercises: Therapy Ball   Flexion 10 reps   ABduction 10 reps     Manual Therapy   Manual Therapy Myofascial release   Manual therapy comments manual therapy completed seperately from all other interventions this date   Myofascial Release myofascial release completed in seated and manual stretching to left upper arm, scapular, and shoulder region to decrease pain and improve pain  free mobility in her left shoulder region.                    OT Short Term Goals - 01/13/17 1400      OT SHORT TERM GOAL #1   Title Patient will be educated on and independent with an HEP for improvedl eft shoulder functional mobility.   Time 4   Period Weeks   Status On-going     OT SHORT TERM GOAL #2   Title Patient will improve left shoulder P/ROM to [facility] in order to improve ease with donning and doffing clothing.    Time 4   Period Weeks   Status On-going     OT SHORT TERM GOAL #3   Title Patient will have 3+/5 strength or better in left arm in order to carry her pocketbook with less pain.   Time 4   Period Weeks   Status On-going     OT SHORT TERM GOAL #4   Title Patient will decrease pain in her left shoulder to 6/10 or better during functional activities.    Time 4   Period Weeks   Status On-going     OT SHORT TERM GOAL #5   Title Patient will decrease fascial restrictions to mod-max in her  left shoulder region.    Time 4   Period Weeks   Status On-going           OT Long Term Goals - 01/13/17 1400      OT LONG TERM GOAL #1   Title Patient will return to her prior level of function with all B/IADLS and leisure activities.    Time 8   Period Weeks   Status On-going     OT LONG TERM GOAL #2   Title Patient will have WFL A/ROM in her left shoulder for increased ability to fix her hair.    Time 8   Period Weeks   Status On-going     OT LONG TERM GOAL #3   Title Patient will have 4/5 strength or better in her left shoulder region in order to lift bags of dog food.    Time 8   Period Weeks   Status On-going     OT LONG TERM GOAL #4   Title Patient will have 4/10 pain or better in her left shoulder when completing daily activities and lying down.    Time 8   Period Weeks   Status On-going     OT LONG TERM GOAL #5   Title Patient will have min-mod fascial restrictions in her left shoulder for greater mobility and ease with ADLs.   Time 8   Period Weeks   Status On-going               Plan - 02/17/17 1307    Clinical Impression Statement A: Pt arrived stating that she is still in a lot of pain. Inquired why patient has not been to therapy in over a month. patient reports that "life" is the reason. She has had a lot of appointments with doctors and financially it has been hard. When asked if coming to therapy is too much patient states "no. I need this." Unable to lay supine so manual was completed seated as well as P/ROM. Patient with increased fascial restrictions in left upper trapezius and scapularis region. Able to tolerate therapy ball stretches and P/ROM. All movements completed with pain noted. VC for form and technique.    Plan  P: Attempted seated. PVC pipe slide or low level thumb tacks.       Patient will benefit from skilled therapeutic intervention in order to improve the following deficits and impairments:  Decreased range of motion,  Decreased mobility, Decreased strength, Impaired perceived functional ability, Increased fascial restricitons, Impaired UE functional use, Pain, Obesity  Visit Diagnosis: Chronic left shoulder pain  Stiffness of left shoulder, not elsewhere classified  Other symptoms and signs involving the musculoskeletal system    Problem List Patient Active Problem List   Diagnosis Date Noted  . Abdominal hernia without obstruction or gangrene 10/12/2016  . Acute pain of left shoulder 10/08/2016  . Varicose veins of bilateral lower extremities with other complications 81/82/9937  . Essential hypertension 03/16/2016  . Lymphedema 03/16/2016  . Breast cancer in female [facility]) 03/16/2016  . Hypothyroidism 03/16/2016  . 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 Prediabetes 03/16/2016  . Obesity 03/16/2016  . Hyperlipidemia 03/16/2016  . Chronic pain syndrome 03/16/2016   Teresa Anderson, OTR/L,CBIS  [phone]  02/17/2017, 1:32 PM  [place] [address] [place], [place], [address] Phone: [phone]   Fax:  [phone]  Name: Minaal Struckman MRN: [id number] Date of Birth: 1940/07/19

## 2017-02-24 ENCOUNTER — Ambulatory Visit (HOSPITAL_COMMUNITY): Payer: Medicare Other

## 2017-02-24 DIAGNOSIS — G8929 Other chronic pain: Secondary | ICD-10-CM

## 2017-02-24 DIAGNOSIS — M25512 Pain in left shoulder: Secondary | ICD-10-CM | POA: Diagnosis not present

## 2017-02-24 DIAGNOSIS — M25612 Stiffness of left shoulder, not elsewhere classified: Secondary | ICD-10-CM

## 2017-02-24 DIAGNOSIS — R29898 Other symptoms and signs involving the musculoskeletal system: Secondary | ICD-10-CM | POA: Diagnosis not present

## 2017-02-24 NOTE — Patient Instructions (Addendum)
1) Seated Row   Sit up straight with elbows by your sides. Pull back with shoulders/elbows, keeping forearms straight, as if pulling back on the reins of a horse. Squeeze shoulder blades together. Repeat _10-12__times, __1-2__sets/day    2) Shoulder Elevation    Sit up straight with arms by your sides. Slowly bring your shoulders up towards your ears. Repeat_10-12__times, _1-2___ sets/day    3) Shoulder Extension    Sit up straight with both arms by your side, draw your arms back behind your waist. Keep your elbows straight. Repeat _10-12___times, _1-2___sets/day.

## 2017-02-24 NOTE — Therapy (Signed)
[place], [place], [address] Phone: [phone]   Fax:  [phone]  Occupational Therapy Treatment  Patient Details  Name: Teresa Anderson MRN: [id number] Date of Birth: 15-Aug-1940 Referring Provider: Dr. Corinna Capra  Encounter Date: 02/24/2017      OT End of Session - 02/24/17 1218    Visit Number 4   Number of Visits 8   Date for OT Re-Evaluation 03/05/17   Authorization Type UHC Medicare   Authorization Time Period before 10th visit   Authorization - Visit Number 4   Authorization - Number of Visits 10   OT Start Time 1120   OT Stop Time 1202   OT Time Calculation (min) 42 min   Activity Tolerance Patient tolerated treatment well   Behavior During Therapy [facility] for tasks assessed/performed      Past Medical History:  Diagnosis Date  . Allergy   . Arthritis   . Asthma   . Blood transfusion without reported diagnosis    with cancer surgery  . Cancer ([place])    breast and skin  . Diabetes mellitus without complication ([place])   . Hyperlipidemia   . Hypertension   . Lymphedema of leg   . Thyroid disease   . Varicose veins of bilateral lower extremities with other complications     Past Surgical History:  Procedure Laterality Date  . ABDOMINAL HYSTERECTOMY    . BREAST SURGERY     bilat  . CESAREAN SECTION     x4  . CHOLECYSTECTOMY    . COSMETIC SURGERY     reconstruction  . MASTECTOMY     bilateral    There were no vitals filed for this visit.      Subjective Assessment - 02/24/17 1215    Subjective  S: I didn't have a good weekend and Monday I couldn't do anything.    Currently in Pain? Yes   Pain Score 7    Pain Location Shoulder   Pain Orientation Left   Pain Descriptors / Indicators Aching   Pain Type Chronic pain   Pain Radiating Towards up neck and down arm   Pain Onset More than a month ago   Pain Frequency Constant   Aggravating Factors  everything (movement)   Pain  Relieving Factors nothing   Effect of Pain on Daily Activities severe   Multiple Pain Sites No            [facility] OT Assessment - 02/24/17 1216      Assessment   Diagnosis Left Shoulder Bursitis     Precautions   Precautions None                  OT Treatments/Exercises (OP) - 02/24/17 1217      Exercises   Exercises Shoulder     Shoulder Exercises: Seated   Elevation AROM;10 reps   Extension AROM;10 reps   Row AROM;10 reps   Protraction PROM;10 reps   Horizontal ABduction PROM;10 reps   External Rotation PROM;10 reps   Internal Rotation PROM;10 reps   Flexion PROM;10 reps   Abduction PROM;10 reps     Shoulder Exercises: ROM/Strengthening   Wall Wash 1'   Thumb Tacks 1'   Other ROM/Strengthening Exercises Seated PVC pipe slide; 10X     Manual Therapy   Manual Therapy Myofascial release   Manual therapy comments manual therapy completed seperately from all other interventions this date   Myofascial Release myofascial release completed  in seated and manual stretching to left upper arm, scapular, and shoulder region to decrease pain and improve pain free mobility in her left shoulder region.                  OT Education - 02/24/17 1215    Education provided Yes   Education Details seated A/ROM scapular exercises   Person(s) Educated Patient   Methods Explanation;Demonstration;Handout;Verbal cues   Comprehension Verbalized understanding;Returned demonstration          OT Short Term Goals - 01/13/17 1400      OT SHORT TERM GOAL #1   Title Patient will be educated on and independent with an HEP for improvedl eft shoulder functional mobility.   Time 4   Period Weeks   Status On-going     OT SHORT TERM GOAL #2   Title Patient will improve left shoulder P/ROM to [facility] in order to improve ease with donning and doffing clothing.    Time 4   Period Weeks   Status On-going     OT SHORT TERM GOAL #3   Title Patient will have 3+/5 strength or  better in left arm in order to carry her pocketbook with less pain.   Time 4   Period Weeks   Status On-going     OT SHORT TERM GOAL #4   Title Patient will decrease pain in her left shoulder to 6/10 or better during functional activities.    Time 4   Period Weeks   Status On-going     OT SHORT TERM GOAL #5   Title Patient will decrease fascial restrictions to mod-max in her left shoulder region.    Time 4   Period Weeks   Status On-going           OT Long Term Goals - 01/13/17 1400      OT LONG TERM GOAL #1   Title Patient will return to her prior level of function with all B/IADLS and leisure activities.    Time 8   Period Weeks   Status On-going     OT LONG TERM GOAL #2   Title Patient will have WFL A/ROM in her left shoulder for increased ability to fix her hair.    Time 8   Period Weeks   Status On-going     OT LONG TERM GOAL #3   Title Patient will have 4/5 strength or better in her left shoulder region in order to lift bags of dog food.    Time 8   Period Weeks   Status On-going     OT LONG TERM GOAL #4   Title Patient will have 4/10 pain or better in her left shoulder when completing daily activities and lying down.    Time 8   Period Weeks   Status On-going     OT LONG TERM GOAL #5   Title Patient will have min-mod fascial restrictions in her left shoulder for greater mobility and ease with ADLs.   Time 8   Period Weeks   Status On-going               Plan - 02/24/17 1219    Clinical Impression Statement A: Patient with increased fascial restrictions in left posterior deltoid and upper trapezius. Education provided regarding godo posture when seated and standing and HEP was updated to assist with strengthening postural muscles. VC for form and technique.    Plan P: reassessment and re-cert. Attempt seated AA/ROM.  Patient will benefit from skilled therapeutic intervention in order to improve the following deficits and impairments:   Decreased range of motion, Decreased mobility, Decreased strength, Impaired perceived functional ability, Increased fascial restricitons, Impaired UE functional use, Pain, Obesity  Visit Diagnosis: Chronic left shoulder pain  Stiffness of left shoulder, not elsewhere classified  Other symptoms and signs involving the musculoskeletal system    Problem List Patient Active Problem List   Diagnosis Date Noted  . Abdominal hernia without obstruction or gangrene 10/12/2016  . Acute pain of left shoulder 10/08/2016  . Varicose veins of bilateral lower extremities with other complications 73/66/8159  . Essential hypertension 03/16/2016  . Lymphedema 03/16/2016  . Breast cancer in female [facility]) 03/16/2016  . Hypothyroidism 03/16/2016  . 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 Prediabetes 03/16/2016  . Obesity 03/16/2016  . Hyperlipidemia 03/16/2016  . Chronic pain syndrome 03/16/2016   Ailene Ravel, OTR/L,CBIS  [phone]  02/24/2017, 12:23 PM  [place] [address] [place], [place], [address] Phone: [phone]   Fax:  [phone]  Name: Teresa Anderson MRN: [id number] Date of Birth: 05/26/1941

## 2017-02-26 ENCOUNTER — Telehealth (HOSPITAL_COMMUNITY): Payer: Self-pay

## 2017-02-26 NOTE — Telephone Encounter (Signed)
Called patient and left message to call clinic back. Therapist will not be here for scheduled appointment on 03/03/17. Therapist remembers speaking to patient regarding appointment but cancelled the appointment on 9/19 instead. Unsure if this was a mistake. Patient was asked to clarify with clinic and call us back.   Ailene Ravel, OTR/L,CBIS  [phone]

## 2017-03-02 ENCOUNTER — Telehealth (HOSPITAL_COMMUNITY): Payer: Self-pay | Admitting: Family Medicine

## 2017-03-02 NOTE — Telephone Encounter (Signed)
03/02/17  she called to see how long the sessions lasted.... when I told her 45 mins. she said that she wouldn't be able to keep this week's appt and would see [place] next Wed.

## 2017-03-03 ENCOUNTER — Ambulatory Visit (HOSPITAL_COMMUNITY): Payer: Medicare Other

## 2017-03-05 ENCOUNTER — Telehealth: Payer: Self-pay | Admitting: Family Medicine

## 2017-03-05 DIAGNOSIS — G894 Chronic pain syndrome: Secondary | ICD-10-CM

## 2017-03-05 MED ORDER — IBUPROFEN 800 MG PO TABS: ORAL_TABLET | ORAL | 1 refills | Status: DC

## 2017-03-05 NOTE — Telephone Encounter (Signed)
PATIENT IS REQUESTING A REFILL OF IBPROFEN, [facility] IN [place].

## 2017-03-09 ENCOUNTER — Encounter: Payer: Self-pay | Admitting: Family Medicine

## 2017-03-09 ENCOUNTER — Ambulatory Visit (INDEPENDENT_AMBULATORY_CARE_PROVIDER_SITE_OTHER): Payer: Medicare Other | Admitting: Family Medicine

## 2017-03-09 VITALS — BP 142/76 | HR 56 | Temp 97.8°F | Resp 18 | Ht 64.0 in | Wt 218.1 lb

## 2017-03-09 DIAGNOSIS — G894 Chronic pain syndrome: Secondary | ICD-10-CM

## 2017-03-09 DIAGNOSIS — I83813 Varicose veins of bilateral lower extremities with pain: Secondary | ICD-10-CM

## 2017-03-09 NOTE — Patient Instructions (Signed)
See me mid November for immunizations

## 2017-03-09 NOTE — Progress Notes (Signed)
Chief Complaint  Patient presents with  . Mass    left leg   Worried about the veins of her left leg.  She has varicose veins L>R chronic.  She has some that are quite engorged and superficial.  It has occurred to her that if wounded, they would bleed terrible and wants to know if they need to be fixed.  In many years of practice I have only seen this once - I think it unlikely.  We discussed the veins can be sclerosed - but not needed unless there is pain and swelling.  She has been evaluated by Vascular surgeon Dr Kellie Simmering who did not think it indicated. She would like handicap parking, and this is appropriate. She wants a note to carry that says she does not need to wear a seat belt.  We argue about this - but her point is that she has had multiple breast procedures and surgeries due to breast cancer and they remain chronically painful.  Her auto has an air bag.  She promises to wear lap belt.  Patient Active Problem List   Diagnosis Date Noted  . Abdominal hernia without obstruction or gangrene 10/12/2016  . Acute pain of left shoulder 10/08/2016  . Varicose veins of bilateral lower extremities with other complications 83/41/9622  . Essential hypertension 03/16/2016  . Lymphedema 03/16/2016  . Breast cancer in female [facility]) 03/16/2016  . Hypothyroidism 03/16/2016  . 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 Prediabetes 03/16/2016  . Obesity 03/16/2016  . Hyperlipidemia 03/16/2016  . Chronic pain syndrome 03/16/2016    Outpatient Encounter Prescriptions as of 03/09/2017  Medication Sig  . atenolol (TENORMIN) 50 MG tablet Take 1 tablet (50 mg total) by mouth daily.  . buprenorphine (BUTRANS - DOSED MCG/HR) 5 MCG/HR PTWK patch Place 5 mcg onto the skin once a week.  . cholecalciferol (VITAMIN D) 1000 units tablet Take 1,000 Units by mouth daily.  Marland Kitchen gabapentin (NEURONTIN) 300 MG capsule Take 1 capsule (300 mg  total) by mouth 3 (three) times daily.  . hydrochlorothiazide (HYDRODIURIL) 25 MG tablet Take 1 tablet (25 mg total) by mouth daily.  Marland Kitchen ibuprofen (ADVIL,MOTRIN) 800 MG tablet TAKE (1) TABLET BY MOUTH THREE TIMES DAILY AS NEEDED.  Marland Kitchen levothyroxine (SYNTHROID) 175 MCG tablet Take 1 tablet (175 mcg total) by mouth daily before breakfast.  . XTAMPZA ER 36 MG C12A   . [DISCONTINUED] Oxycodone HCl 20 MG TABS Take 1 tablet (20 mg total) by mouth every 4 (four) hours as needed. (Patient not taking: Reported on 03/09/2017)   No facility-administered encounter medications on file as of 03/09/2017.     Allergies  Allergen Reactions  . Penicillins Anaphylaxis    Childhood allergy-unknown  . Sulfa Antibiotics Swelling    angioedema  . Demerol [Meperidine] Other (See Comments)    anxiety  . Morphine And Related Itching    side effect     Review of Systems  Constitutional: Negative for activity change, appetite change, chills and fever.  Respiratory: Negative for cough and shortness of breath.   Cardiovascular: Positive for leg swelling. Negative for chest pain and palpitations.       Varicose veins  Gastrointestinal: Positive for abdominal distention and abdominal pain. Negative for constipation and diarrhea.  Genitourinary: Negative for difficulty urinating and frequency.       Breast pain  Musculoskeletal: Positive for arthralgias, back pain and gait problem.  Chronic pain  Skin: Negative for color change and wound.  Psychiatric/Behavioral: Positive for dysphoric mood and sleep disturbance.  All other systems reviewed and are negative.   BP (!) 142/76 (BP Location: Left Arm, Patient Position: Sitting, Cuff Size: Large)   Pulse (!) 56   Temp 97.8 F (36.6 C) (Temporal)   Resp 18   Ht 5\' 4"  (1.626 m)   Wt 218 lb 1.3 oz (98.9 kg)   SpO2 97%   BMI 37.43 kg/m   Physical Exam  Constitutional: She appears well-developed and well-nourished. No distress.  HENT:  Head: Normocephalic  and atraumatic.  Mouth/Throat: Oropharynx is clear and moist.  Eyes: Pupils are equal, round, and reactive to light. Conjunctivae are normal.  Neck: Normal range of motion.  Cardiovascular: Normal rate, regular rhythm and normal heart sounds.   Pulmonary/Chest: Effort normal and breath sounds normal.  Musculoskeletal:  Varicose veins - uncomplicated but prominent are examined  Skin: Skin is warm and dry.  Psychiatric: She has a normal mood and affect. Her behavior is normal. Thought content normal.    ASSESSMENT/PLAN:  1. Varicose veins of both lower extremities with pain   2. Chronic pain syndrome    Patient Instructions  See me mid November for immunizations   Raylene Everts, MD

## 2017-03-10 ENCOUNTER — Telehealth (HOSPITAL_COMMUNITY): Payer: Self-pay

## 2017-03-10 ENCOUNTER — Ambulatory Visit (HOSPITAL_COMMUNITY): Payer: Medicare Other

## 2017-03-10 ENCOUNTER — Ambulatory Visit: Payer: Medicare Other | Admitting: Family Medicine

## 2017-03-10 NOTE — Telephone Encounter (Signed)
Patient was a no show for today's therapy appointment. Attempted to reach patient via phone call regarding no show. No answer and no message left per patient's request in chart.   Ailene Ravel, OTR/L,CBIS  [phone]

## 2017-03-16 DIAGNOSIS — M47817 Spondylosis without myelopathy or radiculopathy, lumbosacral region: Secondary | ICD-10-CM | POA: Diagnosis not present

## 2017-03-16 DIAGNOSIS — G894 Chronic pain syndrome: Secondary | ICD-10-CM | POA: Diagnosis not present

## 2017-03-16 DIAGNOSIS — M19012 Primary osteoarthritis, left shoulder: Secondary | ICD-10-CM | POA: Diagnosis not present

## 2017-03-16 DIAGNOSIS — M7552 Bursitis of left shoulder: Secondary | ICD-10-CM | POA: Diagnosis not present

## 2017-03-17 ENCOUNTER — Ambulatory Visit (INDEPENDENT_AMBULATORY_CARE_PROVIDER_SITE_OTHER): Payer: Medicare Other | Admitting: Family Medicine

## 2017-03-17 ENCOUNTER — Encounter (HOSPITAL_COMMUNITY): Payer: Medicare Other

## 2017-03-17 ENCOUNTER — Telehealth (HOSPITAL_COMMUNITY): Payer: Self-pay

## 2017-03-17 ENCOUNTER — Encounter: Payer: Self-pay | Admitting: Family Medicine

## 2017-03-17 VITALS — HR 60 | Temp 97.8°F | Resp 16 | Ht 64.0 in

## 2017-03-17 DIAGNOSIS — H00012 Hordeolum externum right lower eyelid: Secondary | ICD-10-CM | POA: Diagnosis not present

## 2017-03-17 DIAGNOSIS — M791 Myalgia: Secondary | ICD-10-CM | POA: Diagnosis not present

## 2017-03-17 MED ORDER — DOXYCYCLINE HYCLATE 100 MG PO TABS: 100.0000 mg | ORAL_TABLET | Freq: Two times a day (BID) | ORAL | 0 refills | Status: DC

## 2017-03-17 NOTE — Progress Notes (Signed)
Chief Complaint  Patient presents with  . Eye Problem   Work in appt Has a "stye" for a couple of weeks No vision impairment Is on oxycodone from pain medicine provider.  Has itching.  Advised to take an antihistamine   Patient Active Problem List   Diagnosis Date Noted  . Abdominal hernia without obstruction or gangrene 10/12/2016  . Acute pain of left shoulder 10/08/2016  . Varicose veins of bilateral lower extremities with other complications 37/16/9678  . Essential hypertension 03/16/2016  . Lymphedema 03/16/2016  . Breast cancer in female [facility]) 03/16/2016  . Hypothyroidism 03/16/2016  . 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 Prediabetes 03/16/2016  . Obesity 03/16/2016  . Hyperlipidemia 03/16/2016  . Chronic pain syndrome 03/16/2016    Outpatient Encounter Prescriptions as of 03/17/2017  Medication Sig  . atenolol (TENORMIN) 50 MG tablet Take 1 tablet (50 mg total) by mouth daily.  . cholecalciferol (VITAMIN D) 1000 units tablet Take 1,000 Units by mouth daily.  Marland Kitchen gabapentin (NEURONTIN) 300 MG capsule Take 1 capsule (300 mg total) by mouth 3 (three) times daily.  . hydrochlorothiazide (HYDRODIURIL) 25 MG tablet Take 1 tablet (25 mg total) by mouth daily.  Marland Kitchen ibuprofen (ADVIL,MOTRIN) 800 MG tablet TAKE (1) TABLET BY MOUTH THREE TIMES DAILY AS NEEDED.  Marland Kitchen levothyroxine (SYNTHROID) 175 MCG tablet Take 1 tablet (175 mcg total) by mouth daily before breakfast.  . XTAMPZA ER 36 MG C12A   . [DISCONTINUED] buprenorphine (BUTRANS - DOSED MCG/HR) 5 MCG/HR PTWK patch Place 5 mcg onto the skin once a week.  . doxycycline (VIBRA-TABS) 100 MG tablet Take 1 tablet (100 mg total) by mouth 2 (two) times daily.   No facility-administered encounter medications on file as of 03/17/2017.     Allergies  Allergen Reactions  . Penicillins Anaphylaxis    Childhood allergy-unknown  . Sulfa Antibiotics Swelling   angioedema  . Demerol [Meperidine] Other (See Comments)    anxiety  . Morphine And Related Itching    side effect     Review of Systems  Constitutional: Positive for fatigue. Negative for chills and fever.  HENT: Negative for congestion, postnasal drip, rhinorrhea and sinus pressure.   Eyes: Positive for redness. Negative for discharge, itching and visual disturbance.  Respiratory: Negative for cough and shortness of breath.   Musculoskeletal: Positive for back pain.       Chronic pain  Psychiatric/Behavioral: Positive for dysphoric mood.       Discouraged at poor health  All other systems reviewed and are negative.   Pulse 60   Temp 97.8 F (36.6 C) (Oral)   Resp 16   Ht 5\' 4"  (1.626 m)   SpO2 97%   Physical Exam  Constitutional: She appears well-developed and well-nourished.  Obese.  Tired appearance  HENT:  Head: Normocephalic and atraumatic.  Mouth/Throat: Oropharynx is clear and moist.  Eyes: Pupils are equal, round, and reactive to light. Conjunctivae and EOM are normal. Right eye exhibits hordeolum. Right eye exhibits no chemosis and no discharge. Left eye exhibits no chemosis and no discharge.    Neck: Normal range of motion. No thyromegaly present.  Lymphadenopathy:    She has no cervical adenopathy.  Psychiatric: She has a normal mood and affect. Her behavior is normal.    ASSESSMENT/PLAN:  1. Hordeolum externum of right lower eyelid Discussed management   Has erythema lower lid and suspect early cellulitis.  Allergic  PCN and sulfa.  Will Rx Doxycycline   Patient Instructions  Take the antibiotic twice a day with food Use warm compresses Call if not improving in a week  Try cetirizine ( generic Zyrtec ) for itching     Raylene Everts, MD

## 2017-03-17 NOTE — Telephone Encounter (Signed)
She is having company from out of town and want to be at home with them

## 2017-03-17 NOTE — Patient Instructions (Signed)
Take the antibiotic twice a day with food Use warm compresses Call if not improving in a week  Try cetirizine ( generic Zyrtec ) for itching

## 2017-03-18 ENCOUNTER — Telehealth: Payer: Self-pay | Admitting: Family Medicine

## 2017-03-18 MED ORDER — ERYTHROMYCIN 5 MG/GM OP OINT: 1.0000 "application " | TOPICAL_OINTMENT | Freq: Four times a day (QID) | OPHTHALMIC | 0 refills | Status: DC

## 2017-03-18 NOTE — Telephone Encounter (Signed)
Change to:  erythromycin eye  Ointment QID

## 2017-03-18 NOTE — Telephone Encounter (Signed)
Left message for Teresa Anderson

## 2017-03-18 NOTE — Telephone Encounter (Signed)
Spoke to Rhealynn, has been throwing up, feels its from the doxycycline... Can you change abx

## 2017-03-18 NOTE — Telephone Encounter (Signed)
Patient called wanting to let Dr. Meda Coffee know that the medicine she was prescribed is making her very very sick.

## 2017-03-24 ENCOUNTER — Encounter (HOSPITAL_COMMUNITY): Payer: Medicare Other

## 2017-03-31 ENCOUNTER — Ambulatory Visit (HOSPITAL_COMMUNITY): Payer: Medicare Other | Attending: Family Medicine

## 2017-03-31 ENCOUNTER — Encounter (HOSPITAL_COMMUNITY): Payer: Self-pay

## 2017-03-31 DIAGNOSIS — M25512 Pain in left shoulder: Secondary | ICD-10-CM | POA: Diagnosis not present

## 2017-03-31 DIAGNOSIS — G8929 Other chronic pain: Secondary | ICD-10-CM | POA: Diagnosis not present

## 2017-03-31 DIAGNOSIS — R29898 Other symptoms and signs involving the musculoskeletal system: Secondary | ICD-10-CM | POA: Diagnosis not present

## 2017-03-31 DIAGNOSIS — M25612 Stiffness of left shoulder, not elsewhere classified: Secondary | ICD-10-CM | POA: Diagnosis not present

## 2017-03-31 NOTE — Therapy (Signed)
[facility] [place], [place], [address] Phone: [phone]   Fax:  [phone]  Occupational Therapy Treatment And reassessment Patient Details  Name: Teresa Anderson MRN: [id number] Date of Birth: 24-Nov-1940 Referring Provider: Dr. Corinna Capra  Encounter Date: 03/31/2017      OT End of Session - 03/31/17 1234    Visit Number 5   Number of Visits 8   Authorization Type [employer] Medicare   Authorization Time Period before 10th visit   Authorization - Visit Number 5   Authorization - Number of Visits 10   OT Start Time 1130  reassessment   OT Stop Time 1215   OT Time Calculation (min) 45 min   Activity Tolerance Patient tolerated treatment well   Behavior During Therapy [facility] for tasks assessed/performed      Past Medical History:  Diagnosis Date  . Allergy   . Arthritis   . Asthma   . Blood transfusion without reported diagnosis    with cancer surgery  . Cancer ([place])    breast and skin  . Diabetes mellitus without complication ([place])   . Hyperlipidemia   . Hypertension   . Lymphedema of leg   . Thyroid disease   . Varicose veins of bilateral lower extremities with other complications     Past Surgical History:  Procedure Laterality Date  . ABDOMINAL HYSTERECTOMY    . BREAST SURGERY     bilat  . CESAREAN SECTION     x4  . CHOLECYSTECTOMY    . COSMETIC SURGERY     reconstruction  . MASTECTOMY     bilateral    There were no vitals filed for this visit.      Subjective Assessment - 03/31/17 1231    Subjective  S: This stretching is hurting my back.   Special Tests FOTO score: 55/100 (45% impaired)   Currently in Pain? Yes   Pain Score 7    Pain Location Shoulder   Pain Orientation Left   Pain Descriptors / Indicators Aching   Pain Type Chronic pain   Pain Radiating Towards up neck and down arm   Pain Onset More than a month ago   Pain Frequency Constant   Aggravating Factors  every movement    Pain Relieving Factors nothing   Effect of Pain on Daily Activities severe            [facility] OT Assessment - 03/31/17 1142      Assessment   Diagnosis Left Shoulder Bursitis   Referring Provider Dr. Corinna Capra     Precautions   Precautions None     Prior Function   Level of Independence Independent     ROM / Strength   AROM / PROM / Strength AROM;PROM;Strength     Palpation   Palpation comment Mod fascial restrictions in left upper arm, trapezius, and scapularis region.      AROM   Overall AROM Comments Assessed seated. IR/er adducted. Assessed supine on eval.   AROM Assessment Site Shoulder   Right/Left Shoulder Left   Left Shoulder Flexion 115 Degrees  previous: 90   Left Shoulder ABduction 95 Degrees  previous: 80   Left Shoulder Internal Rotation 90 Degrees  previous: same   Left Shoulder External Rotation 45 Degrees  previous: 0     PROM   Overall PROM Comments Assessed seated. IR/er adducted. Was assessed supine at eval.    PROM Assessment Site Shoulder   Right/Left Shoulder Left  Left Shoulder Flexion 140 Degrees  previous: 120   Left Shoulder ABduction 130 Degrees  previous; 85   Left Shoulder Internal Rotation 90 Degrees  previous: same   Left Shoulder External Rotation 60 Degrees  previous: 0     Strength   Overall Strength Comments Assessed seated. IR/er adducted. Strength not assessed on eval. Pain with all movements.   Strength Assessment Site Shoulder   Left Shoulder Flexion 4/5   Left Shoulder ABduction 4-/5   Left Shoulder Internal Rotation 4/5   Left Shoulder External Rotation 3+/5                  OT Treatments/Exercises (OP) - 03/31/17 1142      Exercises   Exercises Shoulder     Shoulder Exercises: Seated   Protraction PROM;AAROM;10 reps   Horizontal ABduction PROM;AAROM;10 reps   External Rotation PROM;AAROM;10 reps   Internal Rotation PROM;AAROM;10 reps   Flexion PROM;AAROM;10 reps   Abduction PROM;AAROM;10 reps      Manual Therapy   Manual Therapy Myofascial release   Manual therapy comments manual therapy completed seperately from all other interventions this date   Myofascial Release myofascial release completed in seated and manual stretching to left upper arm, scapular, and shoulder region to decrease pain and improve pain free mobility in her left shoulder region.                  OT Education - 03/31/17 1233    Education provided Yes   Education Details Educated patient on progress in therapy and recommended patient follow up with MD regarding next step of treatment. Patient was given list of possible open MRI machines in the state of [place]. Pt's HEP was upgraded to include AA/ROM shoulder exercises.    Person(s) Educated Patient   Methods Explanation;Demonstration;Verbal cues;Handout   Comprehension Returned demonstration;Verbalized understanding          OT Short Term Goals - 03/31/17 1154      OT SHORT TERM GOAL #1   Title Patient will be educated on and independent with an HEP for improved left shoulder functional mobility.   Time 4   Period Weeks   Status Achieved     OT SHORT TERM GOAL #2   Title Patient will improve left shoulder P/ROM to [facility] in order to improve ease with donning and doffing clothing.    Time 4   Period Weeks   Status Achieved     OT SHORT TERM GOAL #3   Title Patient will have 3+/5 strength or better in left arm in order to carry her pocketbook with less pain.   Time 4   Period Weeks   Status Achieved     OT SHORT TERM GOAL #4   Title Patient will decrease pain in her left shoulder to 6/10 or better during functional activities.    Baseline 10/3: Pt reports that pain remains at a 7-8/10 on average.   Time 4   Period Weeks   Status Not Met     OT SHORT TERM GOAL #5   Title Patient will decrease fascial restrictions to mod-max in her left shoulder region.    Time 4   Period Weeks   Status Achieved           OT Long Term Goals -  03/31/17 1202      OT LONG TERM GOAL #1   Title Patient will return to her prior level of function with all B/IADLS and leisure activities.  Time 8   Period Weeks   Status On-going     OT LONG TERM GOAL #2   Title Patient will have WFL A/ROM in her left shoulder for increased ability to fix her hair.    Time 8   Period Weeks   Status Not Met     OT LONG TERM GOAL #3   Title Patient will have 4/5 strength or better in her left shoulder region in order to lift bags of dog food.    Time 8   Period Weeks   Status Not Met     OT LONG TERM GOAL #4   Title Patient will have 4/10 pain or better in her left shoulder when completing daily activities and lying down.    Time 8   Period Weeks   Status Not Met     OT LONG TERM GOAL #5   Title Patient will have min-mod fascial restrictions in her left shoulder for greater mobility and ease with ADLs.   Time 8   Period Weeks   Status Not Met               Plan - 04-17-17 1235    Clinical Impression Statement A: Reassessment completed this date. patient has met 4/5 STGs and no long term goals at this time. patient reports that her LUE continues to hurt with all movement and use. She reports that she is completing her HEP as she is able to although it is sporatic. Pt has improved with her passive and acitve ROM. Pain continues to be a limiting factor. Due to financial reasons, patient is going to continue with updated HEP at home independently and follow up with her MD for next step of treatment. Pt in agreement with discharge.     Plan P: D/C from therapy with HEP.   Consulted and Agree with Plan of Care Patient      Patient will benefit from skilled therapeutic intervention in order to improve the following deficits and impairments:  Decreased range of motion, Decreased mobility, Decreased strength, Impaired perceived functional ability, Increased fascial restricitons, Impaired UE functional use, Pain, Obesity  Visit  Diagnosis: Chronic left shoulder pain  Stiffness of left shoulder, not elsewhere classified  Other symptoms and signs involving the musculoskeletal system      G-Codes - 17-Apr-2017 1237    Functional Assessment Tool Used (Outpatient only) FOTO score: 55/100 (45% impaired)   Functional Limitation Self care   Self Care Goal Status ([id number]) At least 40 percent but less than 60 percent impaired, limited or restricted   Self Care Discharge Status [phone]) At least 40 percent but less than 60 percent impaired, limited or restricted      Problem List Patient Active Problem List   Diagnosis Date Noted  . Abdominal hernia without obstruction or gangrene 10/12/2016  . Acute pain of left shoulder 10/08/2016  . Varicose veins of bilateral lower extremities with other complications 76/73/4193  . Essential hypertension 03/16/2016  . Lymphedema 03/16/2016  . Breast cancer in female [facility]) 03/16/2016  . Hypothyroidism 03/16/2016  . 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 Prediabetes 03/16/2016  . Obesity 03/16/2016  . Hyperlipidemia 03/16/2016  . Chronic pain syndrome 03/16/2016   OCCUPATIONAL THERAPY DISCHARGE SUMMARY  Visits from Start of Care: 5  Current functional level related to goals / functional outcomes: See above   Remaining deficits: See above   Education / Equipment: See  above Plan: Patient agrees to discharge.  Patient goals were partially met. Patient is being discharged due to financial reasons.  ?????         Ailene Ravel, OTR/L,CBIS  [phone]  03/31/2017, 12:39 PM  [facility] [address] [place], [place], [address] Phone: [phone]   Fax:  [phone]  Name: Monserrat Vidaurri MRN: [id number] Date of Birth: [birth year]

## 2017-03-31 NOTE — Patient Instructions (Addendum)
Perform each exercise ___10-12_____ reps. 2-3x days.   Protraction - SITTING  Start by holding a wand or cane at chest height.  Next, slowly push the wand outwards in front of your body so that your elbows become fully straightened. Then, return to the original position.     Shoulder FLEXION - SITTING - PALMS DOWN  In the standing position, hold a wand/cane with both arms, palms down on both sides. Raise up the wand/cane allowing your unaffected arm to perform most of the effort. Your affected arm should be partially relaxed.      Internal/External ROTATION - SITTING  In the standing position, hold a wand/cane with both hands keeping your elbows bent. Move your arms and wand/cane to one side.  Your affected arm should be partially relaxed while your unaffected arm performs most of the effort.       Shoulder ABDUCTION - SITTING  While holding a wand/cane palm face up on the injured side and palm face down on the uninjured side, slowly raise up your injured arm to the side.         Horizontal Abduction/Adduction      Straight arms holding cane at shoulder height, bring cane to right, center, left. Repeat starting to left.   Copyright  VHI. All rights reserved.       open mri machine    .  [facility] Open [facility] [place], [place], [place] [address] [phone]  .   [place], [place], [place] [address] [place] [phone]  .  [facility] [place] [address] [place], [place], [place] [address] [phone]  .   [facility] [place], [place], [place] [address] Yelp [phone]  .   [place] [address] [place], [place], [place] [address] [place] [phone]  .  [place], [place],  [address] Facebook [phone]

## 2017-04-20 ENCOUNTER — Encounter: Payer: Self-pay | Admitting: Family Medicine

## 2017-04-20 ENCOUNTER — Telehealth: Payer: Self-pay | Admitting: Family Medicine

## 2017-04-20 ENCOUNTER — Ambulatory Visit (INDEPENDENT_AMBULATORY_CARE_PROVIDER_SITE_OTHER): Payer: Medicare Other | Admitting: Family Medicine

## 2017-04-20 VITALS — BP 130/80 | HR 64 | Temp 97.6°F | Resp 18 | Ht 64.0 in | Wt 216.0 lb

## 2017-04-20 DIAGNOSIS — E785 Hyperlipidemia, unspecified: Secondary | ICD-10-CM | POA: Diagnosis not present

## 2017-04-20 DIAGNOSIS — G894 Chronic pain syndrome: Secondary | ICD-10-CM | POA: Diagnosis not present

## 2017-04-20 DIAGNOSIS — Z6837 Body mass index (BMI) 37.0-37.9, adult: Secondary | ICD-10-CM

## 2017-04-20 DIAGNOSIS — I10 Essential (primary) hypertension: Secondary | ICD-10-CM

## 2017-04-20 DIAGNOSIS — E039 Hypothyroidism, unspecified: Secondary | ICD-10-CM

## 2017-04-20 DIAGNOSIS — R7303 Prediabetes: Secondary | ICD-10-CM | POA: Diagnosis not present

## 2017-04-20 MED ORDER — IBUPROFEN 800 MG PO TABS: ORAL_TABLET | ORAL | 1 refills | Status: DC

## 2017-04-20 NOTE — Progress Notes (Signed)
Chief Complaint  Patient presents with  . Follow-up    3 month  Routine follow up Complains of heat intolerance and wants thyroid checked. States she "begs" me to take over her narcotic prescriptions/painmanagement.  Does not like current pain office practice and going every month for refills.  Pain is not well managed, per patient.  She feels she needs a short acting narcotic in addition to the 12 hour oxycodone.  She is given the list of pain doctors in the area to choose another.  I am not comfortable providing this care.  I have told her this many times We discussed her obesity ant how it affects her health and her pain.  I recommend she reduce the carbohydrates and sweets in her diet and try to increase activity as tolerated.   Patient Active Problem List   Diagnosis Date Noted  . Abdominal hernia without obstruction or gangrene 10/12/2016  . Acute pain of left shoulder 10/08/2016  . Varicose veins of bilateral lower extremities with other complications 39/76/7341  . Essential hypertension 03/16/2016  . Lymphedema 03/16/2016  . Breast cancer in female [facility]) 03/16/2016  . Hypothyroidism 03/16/2016  . 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 Prediabetes 03/16/2016  . Obesity 03/16/2016  . Hyperlipidemia 03/16/2016  . Chronic pain syndrome 03/16/2016    Outpatient Encounter Prescriptions as of 04/20/2017  Medication Sig  . atenolol (TENORMIN) 50 MG tablet Take 1 tablet (50 mg total) by mouth daily.  . cholecalciferol (VITAMIN D) 1000 units tablet Take 1,000 Units by mouth daily.  Marland Kitchen erythromycin ophthalmic ointment Place 1 application into the right eye 4 (four) times daily.  . hydrochlorothiazide (HYDRODIURIL) 25 MG tablet Take 1 tablet (25 mg total) by mouth daily.  Marland Kitchen ibuprofen (ADVIL,MOTRIN) 800 MG tablet TAKE (1) TABLET BY MOUTH THREE TIMES DAILY AS NEEDED.  Marland Kitchen levothyroxine (SYNTHROID) 175 MCG  tablet Take 1 tablet (175 mcg total) by mouth daily before breakfast.  . XTAMPZA ER 36 MG C12A    No facility-administered encounter medications on file as of 04/20/2017.     Allergies  Allergen Reactions  . Penicillins Anaphylaxis    Childhood allergy-unknown  . Sulfa Antibiotics Swelling    angioedema  . Demerol [Meperidine] Other (See Comments)    anxiety  . Morphine And Related Itching    side effect     Review of Systems  Constitutional: Negative for activity change, appetite change, chills and fever.  Respiratory: Negative for cough and shortness of breath.   Cardiovascular: Positive for leg swelling. Negative for chest pain and palpitations.       Varicose veins  Gastrointestinal: Positive for abdominal pain. Negative for abdominal distention, constipation and diarrhea.       Chronic  Genitourinary: Negative for difficulty urinating and frequency.  Musculoskeletal: Positive for arthralgias, back pain and gait problem.       Chronic pain  Skin: Negative for color change and wound.  Psychiatric/Behavioral: Positive for dysphoric mood and sleep disturbance.       Per patient, cannot take anti depressants  All other systems reviewed and are negative.   BP 130/80 (BP Location: Left Arm, Patient Position: Sitting, Cuff Size: Large)   Pulse 64   Temp 97.6 F (36.4 C) (Temporal)   Resp 18   Ht 5\' 4"  (1.626 m)   Wt 216 lb 0.6 oz (98 kg)   SpO2 98%   BMI 37.08 kg/m  Physical Exam  Constitutional: She appears well-developed and well-nourished. No distress.  Obese.  Depressed mood  HENT:  Head: Normocephalic and atraumatic.  Mouth/Throat: Oropharynx is clear and moist.  Eyes: Pupils are equal, round, and reactive to light. Conjunctivae are normal.  Neck: Normal range of motion.  Cardiovascular: Normal rate, regular rhythm and normal heart sounds.   Pulmonary/Chest: Effort normal and breath sounds normal.  Musculoskeletal:  Varicose veins - uncomplicated but  prominent are examined  Skin: Skin is warm and dry.  Psychiatric: She has a normal mood and affect. Her behavior is normal. Thought content normal.    ASSESSMENT/PLAN:  1. chronic pain syndrome - ibuprofen (ADVIL,MOTRIN) 800 MG tablet; TAKE (1) TABLET BY MOUTH THREE TIMES DAILY AS NEEDED.  Dispense: 90 tablet; Refill: 1  2. Hypothyroidism, unspecified type - TSH  3. Hyperlipidemia, unspecified hyperlipidemia type  4. Essential hypertension - CBC - COMPLETE METABOLIC PANEL WITH GFR  5. Prediabetes - Hemoglobin A1c  6. Obesity  Patient Instructions  I will check new labs Continue under pain management  See me in 3 months  Come back for flu shot    Raylene Everts, MD

## 2017-04-20 NOTE — Telephone Encounter (Signed)
Selena. Please pend

## 2017-04-20 NOTE — Telephone Encounter (Signed)
Patient left message stating the she called Dr. Merlene Laughter regarding an appointment, but they are requiring a referral.

## 2017-04-20 NOTE — Patient Instructions (Signed)
I will check new labs Continue under pain management  See me in 3 months  Come back for flu shot

## 2017-04-21 ENCOUNTER — Telehealth: Payer: Self-pay

## 2017-04-21 ENCOUNTER — Other Ambulatory Visit: Payer: Self-pay | Admitting: Family Medicine

## 2017-04-21 ENCOUNTER — Ambulatory Visit: Payer: Medicare Other

## 2017-04-21 ENCOUNTER — Encounter: Payer: Self-pay | Admitting: Family Medicine

## 2017-04-21 VITALS — BP 134/76 | HR 56

## 2017-04-21 DIAGNOSIS — I10 Essential (primary) hypertension: Secondary | ICD-10-CM

## 2017-04-21 DIAGNOSIS — M47817 Spondylosis without myelopathy or radiculopathy, lumbosacral region: Secondary | ICD-10-CM | POA: Diagnosis not present

## 2017-04-21 DIAGNOSIS — G894 Chronic pain syndrome: Secondary | ICD-10-CM | POA: Diagnosis not present

## 2017-04-21 DIAGNOSIS — M7552 Bursitis of left shoulder: Secondary | ICD-10-CM | POA: Diagnosis not present

## 2017-04-21 DIAGNOSIS — M19012 Primary osteoarthritis, left shoulder: Secondary | ICD-10-CM | POA: Diagnosis not present

## 2017-04-21 LAB — CBC
HEMATOCRIT: 40.8 % (ref 35.0–45.0)
HEMOGLOBIN: 13.3 g/dL (ref 11.7–15.5)
MCH: 28.7 pg (ref 27.0–33.0)
MCHC: 32.6 g/dL (ref 32.0–36.0)
MCV: 87.9 fL (ref 80.0–100.0)
MPV: 10.2 fL (ref 7.5–12.5)
Platelets: 208 10*3/uL (ref 140–400)
RBC: 4.64 10*6/uL (ref 3.80–5.10)
RDW: 12.8 % (ref 11.0–15.0)
WBC: 4.5 10*3/uL (ref 3.8–10.8)

## 2017-04-21 LAB — HEMOGLOBIN A1C
EAG (MMOL/L): 7 (calc)
Hgb A1c MFr Bld: 6 % of total Hgb — ABNORMAL HIGH (ref <5.7)
MEAN PLASMA GLUCOSE: 126 (calc)

## 2017-04-21 LAB — COMPLETE METABOLIC PANEL WITH GFR
AG Ratio: 1.4 (calc) (ref 1.0–2.5)
ALBUMIN MSPROF: 4.1 g/dL (ref 3.6–5.1)
ALT: 12 U/L (ref 6–29)
AST: 16 U/L (ref 10–35)
Alkaline phosphatase (APISO): 85 U/L (ref 33–130)
BUN/Creatinine Ratio: 37 (calc) — ABNORMAL HIGH (ref 6–22)
BUN: 31 mg/dL — ABNORMAL HIGH (ref 7–25)
CALCIUM: 9.2 mg/dL (ref 8.6–10.4)
CO2: 31 mmol/L (ref 20–32)
CREATININE: 0.84 mg/dL (ref 0.60–0.93)
Chloride: 98 mmol/L (ref 98–110)
GFR, EST AFRICAN AMERICAN: 78 mL/min/{1.73_m2} (ref >=60)
GFR, EST NON AFRICAN AMERICAN: 67 mL/min/{1.73_m2} (ref >=60)
GLOBULIN: 2.9 g/dL (ref 1.9–3.7)
Glucose, Bld: 109 mg/dL — ABNORMAL HIGH (ref 65–99)
POTASSIUM: 5 mmol/L (ref 3.5–5.3)
SODIUM: 136 mmol/L (ref 135–146)
TOTAL PROTEIN: 7 g/dL (ref 6.1–8.1)
Total Bilirubin: 0.6 mg/dL (ref 0.2–1.2)

## 2017-04-21 LAB — TSH: TSH: 4.91 mIU/L — ABNORMAL HIGH (ref 0.40–4.50)

## 2017-04-21 MED ORDER — LEVOTHYROXINE SODIUM 200 MCG PO TABS: 200.0000 ug | ORAL_TABLET | Freq: Every day | ORAL | 3 refills | Status: AC

## 2017-04-21 NOTE — Telephone Encounter (Signed)
Pt stopped in office to have b/p checked.states she was at her pain management appt . And they tried " 9 times" to get her b/p and were unable, and were also unable to get her o2 sat. B/p here was 134/76, o2 sat 97 % pulse 56.

## 2017-04-22 NOTE — Telephone Encounter (Signed)
noted 

## 2017-05-24 ENCOUNTER — Telehealth: Payer: Self-pay | Admitting: Family Medicine

## 2017-05-24 NOTE — Telephone Encounter (Signed)
Patient left message on nurse line requesting a call back from [place]. Callback# [phone]

## 2017-05-24 NOTE — Telephone Encounter (Signed)
Pt called asking for our list of pain drs be mailed to her. Placed in outgoing mail tonight.

## 2017-06-05 ENCOUNTER — Other Ambulatory Visit: Payer: Self-pay | Admitting: Family Medicine

## 2017-06-08 NOTE — Telephone Encounter (Signed)
Seen 10 23 18

## 2017-06-11 ENCOUNTER — Telehealth: Payer: Self-pay | Admitting: Family Medicine

## 2017-06-11 NOTE — Telephone Encounter (Signed)
Patient left message on nurse line regarding her pain medication and referral. She was upset and crying. She states she has tried to call [place]'s office. It has been 2 months since the referral was places and she has not heard anything from them. She states she has recently started a new job and she is scared she is going to lose it if she keeps having to drives to [place] once a month. She states she's not the one who got her on pain pills, a doctor did. She states we were going to check with another provider. She asks if there is anyone closer than can help her.  Callback# [phone]

## 2017-06-11 NOTE — Telephone Encounter (Signed)
Called and left message with Teresa Anderson

## 2017-06-11 NOTE — Telephone Encounter (Signed)
Called [facility], given mds advice. States d/t a new job she cant go to [employer] every month, has been trying to find someone closer.

## 2017-06-11 NOTE — Telephone Encounter (Signed)
Teresa Anderson has a pain provider.  She does not like this pain provider and wants another one.  She does not need this office to make this change, she can call the pain providers herself.  She has been given a list of pain providers on at least 2 occasions.  Unfortunately, we do not get a good response when called Dr. Freddie Apley office either.  I will place a call to his office manager today, and expect to leave a message on her machine.  This is the most I can do to facilitate the referral.  I am sorry that she is so distressed.

## 2017-06-16 DIAGNOSIS — M791 Myalgia, unspecified site: Secondary | ICD-10-CM | POA: Diagnosis not present

## 2017-06-24 DIAGNOSIS — M25512 Pain in left shoulder: Secondary | ICD-10-CM | POA: Diagnosis not present

## 2017-06-24 DIAGNOSIS — E039 Hypothyroidism, unspecified: Secondary | ICD-10-CM | POA: Diagnosis not present

## 2017-07-10 DIAGNOSIS — M25512 Pain in left shoulder: Secondary | ICD-10-CM | POA: Diagnosis not present

## 2017-07-10 DIAGNOSIS — R600 Localized edema: Secondary | ICD-10-CM | POA: Diagnosis not present

## 2017-07-10 DIAGNOSIS — E039 Hypothyroidism, unspecified: Secondary | ICD-10-CM | POA: Diagnosis not present

## 2017-07-10 DIAGNOSIS — G8929 Other chronic pain: Secondary | ICD-10-CM | POA: Diagnosis not present

## 2017-07-10 DIAGNOSIS — E559 Vitamin D deficiency, unspecified: Secondary | ICD-10-CM | POA: Diagnosis not present

## 2017-07-10 DIAGNOSIS — Z2821 Immunization not carried out because of patient refusal: Secondary | ICD-10-CM | POA: Diagnosis not present

## 2017-07-10 DIAGNOSIS — I10 Essential (primary) hypertension: Secondary | ICD-10-CM | POA: Diagnosis not present

## 2017-07-15 DIAGNOSIS — E039 Hypothyroidism, unspecified: Secondary | ICD-10-CM | POA: Diagnosis not present

## 2017-07-15 DIAGNOSIS — E559 Vitamin D deficiency, unspecified: Secondary | ICD-10-CM | POA: Diagnosis not present

## 2017-07-15 DIAGNOSIS — I10 Essential (primary) hypertension: Secondary | ICD-10-CM | POA: Diagnosis not present

## 2017-07-15 DIAGNOSIS — E782 Mixed hyperlipidemia: Secondary | ICD-10-CM | POA: Diagnosis not present

## 2017-07-21 DIAGNOSIS — I10 Essential (primary) hypertension: Secondary | ICD-10-CM | POA: Diagnosis not present

## 2017-07-21 DIAGNOSIS — E559 Vitamin D deficiency, unspecified: Secondary | ICD-10-CM | POA: Diagnosis not present

## 2017-07-21 DIAGNOSIS — G8929 Other chronic pain: Secondary | ICD-10-CM | POA: Diagnosis not present

## 2017-07-21 DIAGNOSIS — Z2821 Immunization not carried out because of patient refusal: Secondary | ICD-10-CM | POA: Diagnosis not present

## 2017-07-21 DIAGNOSIS — R600 Localized edema: Secondary | ICD-10-CM | POA: Diagnosis not present

## 2017-07-21 DIAGNOSIS — E039 Hypothyroidism, unspecified: Secondary | ICD-10-CM | POA: Diagnosis not present

## 2017-07-21 DIAGNOSIS — M25512 Pain in left shoulder: Secondary | ICD-10-CM | POA: Diagnosis not present

## 2017-07-21 DIAGNOSIS — Z853 Personal history of malignant neoplasm of breast: Secondary | ICD-10-CM | POA: Diagnosis not present

## 2017-07-23 DIAGNOSIS — M19012 Primary osteoarthritis, left shoulder: Secondary | ICD-10-CM | POA: Diagnosis not present

## 2017-08-02 ENCOUNTER — Ambulatory Visit: Payer: Medicare Other | Admitting: Family Medicine

## 2017-08-03 DIAGNOSIS — G894 Chronic pain syndrome: Secondary | ICD-10-CM | POA: Diagnosis not present

## 2017-08-10 DIAGNOSIS — R21 Rash and other nonspecific skin eruption: Secondary | ICD-10-CM | POA: Diagnosis not present

## 2017-08-10 DIAGNOSIS — G894 Chronic pain syndrome: Secondary | ICD-10-CM | POA: Diagnosis not present

## 2017-08-10 DIAGNOSIS — Z6834 Body mass index (BMI) 34.0-34.9, adult: Secondary | ICD-10-CM | POA: Diagnosis not present

## 2017-08-23 DIAGNOSIS — G894 Chronic pain syndrome: Secondary | ICD-10-CM | POA: Diagnosis not present

## 2017-08-23 DIAGNOSIS — B379 Candidiasis, unspecified: Secondary | ICD-10-CM | POA: Diagnosis not present

## 2017-08-23 DIAGNOSIS — R21 Rash and other nonspecific skin eruption: Secondary | ICD-10-CM | POA: Diagnosis not present

## 2017-08-30 DIAGNOSIS — E039 Hypothyroidism, unspecified: Secondary | ICD-10-CM | POA: Diagnosis not present

## 2017-09-01 DIAGNOSIS — G8929 Other chronic pain: Secondary | ICD-10-CM | POA: Diagnosis not present

## 2017-09-01 DIAGNOSIS — E039 Hypothyroidism, unspecified: Secondary | ICD-10-CM | POA: Diagnosis not present

## 2017-09-01 DIAGNOSIS — M25512 Pain in left shoulder: Secondary | ICD-10-CM | POA: Diagnosis not present

## 2017-09-01 DIAGNOSIS — Z6837 Body mass index (BMI) 37.0-37.9, adult: Secondary | ICD-10-CM | POA: Diagnosis not present

## 2017-09-03 DIAGNOSIS — M19012 Primary osteoarthritis, left shoulder: Secondary | ICD-10-CM | POA: Diagnosis not present

## 2017-09-28 DIAGNOSIS — Z6836 Body mass index (BMI) 36.0-36.9, adult: Secondary | ICD-10-CM | POA: Diagnosis not present

## 2017-09-28 DIAGNOSIS — E039 Hypothyroidism, unspecified: Secondary | ICD-10-CM | POA: Diagnosis not present

## 2017-09-28 DIAGNOSIS — Z6834 Body mass index (BMI) 34.0-34.9, adult: Secondary | ICD-10-CM | POA: Diagnosis not present

## 2017-09-28 DIAGNOSIS — E559 Vitamin D deficiency, unspecified: Secondary | ICD-10-CM | POA: Diagnosis not present

## 2017-09-28 DIAGNOSIS — R21 Rash and other nonspecific skin eruption: Secondary | ICD-10-CM | POA: Diagnosis not present

## 2017-09-28 DIAGNOSIS — M25512 Pain in left shoulder: Secondary | ICD-10-CM | POA: Diagnosis not present

## 2017-09-28 DIAGNOSIS — I10 Essential (primary) hypertension: Secondary | ICD-10-CM | POA: Diagnosis not present

## 2017-09-28 DIAGNOSIS — G894 Chronic pain syndrome: Secondary | ICD-10-CM | POA: Diagnosis not present

## 2017-09-28 DIAGNOSIS — Z6837 Body mass index (BMI) 37.0-37.9, adult: Secondary | ICD-10-CM | POA: Diagnosis not present

## 2017-09-28 DIAGNOSIS — Z2821 Immunization not carried out because of patient refusal: Secondary | ICD-10-CM | POA: Diagnosis not present

## 2017-09-28 DIAGNOSIS — R600 Localized edema: Secondary | ICD-10-CM | POA: Diagnosis not present

## 2017-09-28 DIAGNOSIS — M25511 Pain in right shoulder: Secondary | ICD-10-CM | POA: Diagnosis not present

## 2017-09-28 DIAGNOSIS — B379 Candidiasis, unspecified: Secondary | ICD-10-CM | POA: Diagnosis not present

## 2017-09-28 DIAGNOSIS — G8929 Other chronic pain: Secondary | ICD-10-CM | POA: Diagnosis not present

## 2017-09-28 DIAGNOSIS — M1990 Unspecified osteoarthritis, unspecified site: Secondary | ICD-10-CM | POA: Diagnosis not present

## 2017-10-08 ENCOUNTER — Other Ambulatory Visit: Payer: Self-pay | Admitting: Family Medicine

## 2017-11-16 DIAGNOSIS — H00016 Hordeolum externum left eye, unspecified eyelid: Secondary | ICD-10-CM | POA: Diagnosis not present

## 2017-11-16 DIAGNOSIS — Z6837 Body mass index (BMI) 37.0-37.9, adult: Secondary | ICD-10-CM | POA: Diagnosis not present

## 2017-12-06 DIAGNOSIS — E039 Hypothyroidism, unspecified: Secondary | ICD-10-CM | POA: Diagnosis not present

## 2017-12-21 DIAGNOSIS — Z6837 Body mass index (BMI) 37.0-37.9, adult: Secondary | ICD-10-CM | POA: Diagnosis not present

## 2017-12-21 DIAGNOSIS — E559 Vitamin D deficiency, unspecified: Secondary | ICD-10-CM | POA: Diagnosis not present

## 2017-12-21 DIAGNOSIS — I10 Essential (primary) hypertension: Secondary | ICD-10-CM | POA: Diagnosis not present

## 2017-12-21 DIAGNOSIS — M25512 Pain in left shoulder: Secondary | ICD-10-CM | POA: Diagnosis not present

## 2017-12-21 DIAGNOSIS — R600 Localized edema: Secondary | ICD-10-CM | POA: Diagnosis not present

## 2017-12-21 DIAGNOSIS — E039 Hypothyroidism, unspecified: Secondary | ICD-10-CM | POA: Diagnosis not present

## 2017-12-21 DIAGNOSIS — Z853 Personal history of malignant neoplasm of breast: Secondary | ICD-10-CM | POA: Diagnosis not present

## 2017-12-21 DIAGNOSIS — G8929 Other chronic pain: Secondary | ICD-10-CM | POA: Diagnosis not present

## 2017-12-21 DIAGNOSIS — E782 Mixed hyperlipidemia: Secondary | ICD-10-CM | POA: Diagnosis not present

## 2018-01-04 NOTE — Progress Notes (Deleted)
Office Visit Note  Patient: Teresa Anderson             Date of Birth: Dec 30, 1940           MRN: [id number]             PCP: Celene Squibb, MD Referring: Celene Squibb, MD Visit Date: 01/18/2018 Occupation: @GUAROCC @    Subjective:  No chief complaint on file.   History of Present Illness: Teresa Anderson is a 77 y.o. female ***   Activities of Daily Living:  Patient reports morning stiffness for *** {minute/hour:19697}.   Patient {ACTIONS;DENIES/REPORTS:21021675::"Denies"} nocturnal pain.  Difficulty dressing/grooming: {ACTIONS;DENIES/REPORTS:21021675::"Denies"} Difficulty climbing stairs: {ACTIONS;DENIES/REPORTS:21021675::"Denies"} Difficulty getting out of chair: {ACTIONS;DENIES/REPORTS:21021675::"Denies"} Difficulty using hands for taps, buttons, cutlery, and/or writing: {ACTIONS;DENIES/REPORTS:21021675::"Denies"}   No Rheumatology ROS completed.   PMFS History:  Patient Active Problem List   Diagnosis Date Noted  . Abdominal hernia without obstruction or gangrene 10/12/2016  . Acute pain of left shoulder 10/08/2016  . Varicose veins of bilateral lower extremities with other complications 03/21/3006  . Essential hypertension 03/16/2016  . Lymphedema 03/16/2016  . Breast cancer in female [facility]) 03/16/2016  . Hypothyroidism 03/16/2016  . 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 Prediabetes 03/16/2016  . Obesity 03/16/2016  . Hyperlipidemia 03/16/2016  . Chronic pain syndrome 03/16/2016    Past Medical History:  Diagnosis Date  . Allergy   . Arthritis   . Asthma   . Blood transfusion without reported diagnosis    with cancer surgery  . Cancer ([place])    breast and skin  . Diabetes mellitus without complication ([place])   . Hyperlipidemia   . Hypertension   . Lymphedema of leg   . Thyroid disease   . Varicose veins of bilateral lower extremities with other complications     Family  History  Problem Relation Age of Onset  . Heart disease Mother 18       heart attack  . COPD Father        former smoker  . Pancreatic cancer Father   . Celiac disease Daughter   . Cancer Maternal Aunt        lung-smoker   Past Surgical History:  Procedure Laterality Date  . ABDOMINAL HYSTERECTOMY    . BREAST SURGERY     bilat  . CESAREAN SECTION     x4  . CHOLECYSTECTOMY    . COSMETIC SURGERY     reconstruction  . MASTECTOMY     bilateral   Social History   Social History Narrative  . Not on file     Objective: Vital Signs: There were no vitals taken for this visit.   Physical Exam   Musculoskeletal Exam: ***  CDAI Exam: No CDAI exam completed.    Investigation: Findings:  09/28/17: TB Gold negative, RF 15.3, ANA positive, RNP negative, Smith negative, SCL 70-, SS-A >8.0, SS-B 5.0, anti-chromatin antibodies negative, Anti-Jo-1 negative, anti-centromere B negative, CCP negative, sed rate 16  CBC Latest Ref Rng & Units 04/20/2017 12/14/2016  WBC 3.8 - 10.8 Thousand/uL 4.5 5.1  Hemoglobin 11.7 - 15.5 g/dL 13.3 13.2  Hematocrit 35.0 - 45.0 % 40.8 40.5  Platelets 140 - 400 Thousand/uL 208 240   CMP Latest Ref Rng & Units 04/20/2017 12/14/2016 11/04/2016  Glucose 65 - 99 mg/dL 109(H) 102(H) -  BUN 7 - 25 mg/dL 31(H) 31(H) -  Creatinine 0.60 - 0.93 mg/dL 0.84 0.97(H) 1.10(H)  Sodium 135 - 146 mmol/L 136 138 -  Potassium 3.5 - 5.3 mmol/L 5.0 4.5 -  Chloride 98 - 110 mmol/L 98 99 -  CO2 20 - 32 mmol/L 31 29 -  Calcium 8.6 - 10.4 mg/dL 9.2 9.4 -  Total Protein 6.1 - 8.1 g/dL 7.0 7.3 -  Total Bilirubin 0.2 - 1.2 mg/dL 0.6 0.6 -  Alkaline Phos 33 - 130 U/L - 86 -  AST 10 - 35 U/L 16 19 -  ALT 6 - 29 U/L 12 13 -     Imaging: No results found.  Speciality Comments: No specialty comments available.    Procedures:  No procedures performed Allergies: Penicillins; Sulfa antibiotics; Demerol [meperidine]; and Morphine and related   Assessment / Plan:     Visit  Diagnoses: Positive ANA (antinuclear antibody)  History of hypothyroidism  Essential hypertension  Other insomnia  Vitamin D deficiency  Dry mouth  Dry eyes  Hx of breast cancer  Pedal edema  Dyslipidemia    Orders: No orders of the defined types were placed in this encounter.  No orders of the defined types were placed in this encounter.   Face-to-face time spent with patient was *** minutes. Greater than 50% of time was spent in counseling and coordination of care.  Follow-Up Instructions: No follow-ups on file.   Ofilia Neas, PA-C  Note - This record has been created using Dragon software.  Chart creation errors have been sought, but may not always  have been located. Such creation errors do not reflect on  the standard of medical care.

## 2018-01-18 ENCOUNTER — Ambulatory Visit: Payer: Medicare Other | Admitting: Rheumatology

## 2018-01-24 DIAGNOSIS — I10 Essential (primary) hypertension: Secondary | ICD-10-CM | POA: Diagnosis not present

## 2018-01-24 DIAGNOSIS — R600 Localized edema: Secondary | ICD-10-CM | POA: Diagnosis not present

## 2018-01-24 DIAGNOSIS — E782 Mixed hyperlipidemia: Secondary | ICD-10-CM | POA: Diagnosis not present

## 2018-01-24 DIAGNOSIS — E039 Hypothyroidism, unspecified: Secondary | ICD-10-CM | POA: Diagnosis not present

## 2018-01-24 DIAGNOSIS — Z853 Personal history of malignant neoplasm of breast: Secondary | ICD-10-CM | POA: Diagnosis not present

## 2018-01-24 DIAGNOSIS — M1990 Unspecified osteoarthritis, unspecified site: Secondary | ICD-10-CM | POA: Diagnosis not present

## 2018-01-24 DIAGNOSIS — Z2821 Immunization not carried out because of patient refusal: Secondary | ICD-10-CM | POA: Diagnosis not present

## 2018-01-25 NOTE — Progress Notes (Signed)
Office Visit Note  Patient: Teresa Anderson             Date of Birth: 04-Mar-1941           MRN: [id number]             PCP: Celene Squibb, MD Referring: Celene Squibb, MD Visit Date: 02/08/2018 Occupation: @GUAROCC @  Subjective:  (Joint pain)   History of Present Illness: Ellarose Brandi is a 77 y.o. female seen in consultation per request of her PCP.  According to patient she has had history of lower extremity pain she was in her 36s.  She continues to have pain and discomfort in multiple joints over the years.  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  She also had bilateral total knee replacements.  She has pain in all of her joints and muscles.  She has been going to the pain management and she states that the medications have not been helpful to manage her pain.  She also gives history of dry mouth and dry eyes which she relates to her medications.  She has had redness on her face for many years.  She denies any joint swelling.  Activities of Daily Living:  Patient reports morning stiffness for 2 hours.   Patient Reports nocturnal pain.  Difficulty dressing/grooming: Reports Difficulty climbing stairs: Reports Difficulty getting out of chair: Reports Difficulty using hands for taps, buttons, cutlery, and/or writing: Denies  Review of Systems  Constitutional: Positive for fatigue. Negative for night sweats, weight gain and weight loss.  HENT: Positive for mouth dryness. Negative for mouth sores, trouble swallowing, trouble swallowing and nose dryness.   Eyes: Positive for dryness. Negative for pain, redness and visual disturbance.  Respiratory: Negative for cough, shortness of breath and difficulty breathing.   Cardiovascular: Negative for chest pain, palpitations, hypertension, irregular heartbeat and swelling in legs/feet.  Gastrointestinal: Negative for blood in stool, constipation and diarrhea.    Endocrine: Negative for increased urination.  Genitourinary: Negative for difficulty urinating and vaginal dryness.  Musculoskeletal: Positive for arthralgias, joint pain and morning stiffness. Negative for gait problem, joint swelling, myalgias, muscle weakness, muscle tenderness and myalgias.  Skin: Positive for hair loss and sensitivity to sunlight. Negative for color change, rash, skin tightness and ulcers.  Allergic/Immunologic: Negative for susceptible to infections.  Neurological: Positive for weakness. Negative for dizziness, memory loss and night sweats.  Hematological: Negative for bruising/bleeding tendency and swollen glands.  Psychiatric/Behavioral: Positive for sleep disturbance. Negative for depressed mood. The patient is not nervous/anxious.     PMFS History:  Patient Active Problem List   Diagnosis Date Noted  . Abdominal hernia without obstruction or gangrene 10/12/2016  . Acute pain of left shoulder 10/08/2016  . Varicose veins of bilateral lower extremities with other complications 17/40/8144  . Essential hypertension 03/16/2016  . Lymphedema 03/16/2016  . Breast cancer in female [facility]) 03/16/2016  . Hypothyroidism 03/16/2016  . 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 Prediabetes 03/16/2016  . Obesity 03/16/2016  . Hyperlipidemia 03/16/2016  . Chronic pain syndrome 03/16/2016    Past Medical History:  Diagnosis Date  . Allergy   . Arthritis   . Asthma   . Blood transfusion without reported diagnosis    with cancer surgery  . Cancer ([place])    breast and skin  . Diabetes mellitus without complication ([place])   .  Hyperlipidemia   . Hypertension   . Lymphedema of leg   . Thyroid disease   . Varicose veins of bilateral lower extremities with other complications     Family History  Problem Relation Age of Onset  . Heart disease Mother 106       heart attack  . COPD Father         former smoker  . Pancreatic cancer Father   . Celiac disease Daughter   . Cancer Maternal Aunt        lung-smoker   Past Surgical History:  Procedure Laterality Date  . ABDOMINAL HYSTERECTOMY    . BREAST SURGERY     bilat  . CESAREAN SECTION     x4  . CHOLECYSTECTOMY    . COSMETIC SURGERY     reconstruction  . MASTECTOMY     bilateral   Social History   Social History Narrative  . Not on file    Objective: Vital Signs: BP (!) 146/69 (BP Location: Left Arm, Patient Position: Sitting, Cuff Size: Normal) Comment: Cannot use right arm  Pulse (!) 58   Resp 18   Ht 5\' 1"  (1.549 m)   Wt 220 lb 3.2 oz (99.9 kg)   BMI 41.61 kg/m    Physical Exam  Constitutional: She is oriented to person, place, and time. She appears well-developed and well-nourished.  HENT:  Head: Normocephalic and atraumatic.  Eyes: Conjunctivae and EOM are normal.  Neck: Normal range of motion.  Cardiovascular: Normal rate, regular rhythm, normal heart sounds and intact distal pulses.  Pulmonary/Chest: Effort normal and breath sounds normal.  Abdominal: Soft. Bowel sounds are normal.  Lymphadenopathy:    She has no cervical adenopathy.  Neurological: She is alert and oriented to person, place, and time.  Skin: Skin is warm and dry. Capillary refill takes less than 2 seconds.  Erythema of forehead, cheeks and chin  Psychiatric: She has a normal mood and affect. Her behavior is normal.  Nursing note and vitals reviewed.    Musculoskeletal Exam: C-spine thoracic lumbar spine limited range of motion with discomfort.  She has limited extension of her left shoulder joint.  Elbow joints were in good range of motion.  She has tenderness across MCPs and PIPs but no synovitis was noted.  Hip joints were difficult to assess that she could not lie down on the table.  Knee joints are replaced and had good range of motion.  Ankle joints were in good range of motion.  She has tenderness across MTPs but no synovitis was  noted.  CDAI Exam: No CDAI exam completed.   Investigation: Findings:  09/28/17: ANA+, RF 15.3, C3 153, C4 25, dsDNA 6, RNP <0.2, Scl-70 <0.2, SS-A >8, SS-B 5.0, anti-chromatin 0.2, anti-Jo 1 <0.2, CCP 6, Sed rate 16, TB -   Imaging: Xr Foot 2 Views Left  Result Date: 02/08/2018 First MTP severe narrowing was noted.  Subluxation of MTPs was noted.  PIP and DIP narrowing was noted.  Intertarsal joint space narrowing was noted.  Calcaneal spur was noted. Impression: These findings are consistent with osteoarthritis and inflammatory arthritis overlap.  Xr Foot 2 Views Right  Result Date: 02/08/2018 First MTP severe narrowing was noted.  Subluxation of MTPs was noted.  PIP and DIP narrowing was noted.  Intertarsal joint space narrowing was noted.  Calcaneal spur was noted. Impression: These findings are consistent with osteoarthritis and inflammatory arthritis overlap.  Xr Hand 2 View Left  Result Date: 02/08/2018 Severe first  second and third MCP narrowing was noted.  No erosive changes were noted.  First CMC severe narrowing was noted.  DIP and PIP narrowing was noted.  No significant intercarpal or radiocarpal joint space narrowing was noted. Impression: These findings are consistent with inflammatory arthritis and osteoarthritis overlap.  Xr Hand 2 View Right  Result Date: 02/08/2018 Right first second and third MCP narrowing was noted.  No erosive changes were noted.  Severe CMC narrowing, PIP and DIP narrowing was noted.  No significant intercarpal radiocarpal joint space narrowing was noted. Impression: These findings are consistent with inflammatory arthritis.   Recent Labs: Lab Results  Component Value Date   WBC 4.5 04/20/2017   HGB 13.3 04/20/2017   PLT 208 04/20/2017   NA 136 04/20/2017   K 5.0 04/20/2017   CL 98 04/20/2017   CO2 31 04/20/2017   GLUCOSE 109 (H) 04/20/2017   BUN 31 (H) 04/20/2017   CREATININE 0.84 04/20/2017   BILITOT 0.6 04/20/2017   ALKPHOS 86  12/14/2016   AST 16 04/20/2017   ALT 12 04/20/2017   PROT 7.0 04/20/2017   ALBUMIN 4.1 12/14/2016   CALCIUM 9.2 04/20/2017   GFRAA 78 04/20/2017    Speciality Comments: No specialty comments available.  Procedures:  No procedures performed Allergies: Penicillins; Sulfa antibiotics; Demerol [meperidine]; and Morphine and related   Assessment / Plan:     Visit Diagnoses: Positive ANA (antinuclear antibody) - 09/28/17: ANA+, RF 15.3, C3 153, C4 25, dsDNA 6, RNP <0.2, Scl-70 <0.2, SS-A >8, SS-B 5.0, anti-chromatin 0.2, anti-Jo 1 <0.2, CCP 6, Sed rate 16, TB -.  Patient has positive ANA, positive SSA and SSB.  She also gives history of dry mouth.  She believes the dry mouth is related to her medications.  She has positive low titer rheumatoid factor.  There is no synovitis on examination.  I will obtain following labs to evaluate this further.  Dry mouth-over-the-counter products were discussed.  Pain in both hands - Plan: XR Hand 2 View Right, XR Hand 2 View Left.  X-ray of bilateral hands were consistent with inflammatory arthritis and osteoarthritis overlap.  I will schedule ultrasound of bilateral hands to look for synovitis.  Pain in both feet - Plan: XR Foot 2 Views Right, XR Foot 2 Views Left.  The x-ray of bilateral feet were consistent with inflammatory arthritis and osteoarthritis overlap.  History of bilateral knee replacement-patient reports having bilateral total knee replacement due to severe osteoarthritis in her knee joints.  Primary osteoarthritis, left shoulder - Dr. Mardelle Matte, cortisone injpossible rorator cuff tear arthropathy patient reports that she has been told to undergo total shoulder replacement but she has not decided yet.  DDD (degenerative disc disease), lumbar-she has chronic pain and has been going to pain management.  Chronic pain syndrome - Oxycodone 10 mg TID PRN.  Despite medications her pain is not under control per patient.  Other medical problems are  listed as follows:  Essential hypertension  Prediabetes  History of breast cancer - Bilateral masectomy with reconstruction 2001  History of hypothyroidism  History of hyperlipidemia  Varicose veins of bilateral lower extremities with other complications  History of asthma    Orders: Orders Placed This Encounter  Procedures  . XR Hand 2 View Right  . XR Hand 2 View Left  . XR Foot 2 Views Right  . XR Foot 2 Views Left  . CBC with Differential/Platelet  . COMPLETE METABOLIC PANEL WITH GFR  . Urinalysis, Routine w reflex  microscopic  . CK  . Sedimentation rate  . ANA  . Beta-2 glycoprotein antibodies  . Cardiolipin antibodies, IgG, IgM, IgA  . Lupus Anticoagulant Eval w/Reflex  . Hepatitis B core antibody, IgM  . Hepatitis B surface antigen  . Hepatitis C antibody  . HIV antibody  . QuantiFERON-TB Gold Plus  . Serum protein electrophoresis with reflex  . IgG, IgA, IgM  . Glucose 6 phosphate dehydrogenase   No orders of the defined types were placed in this encounter.   Face-to-face time spent with patient was 50 minutes. Greater than 50% of time was spent in counseling and coordination of care.  Follow-Up Instructions: Return for +ANA, OA.   Bo Merino, MD  Note - This record has been created using Editor, commissioning.  Chart creation errors have been sought, but may not always  have been located. Such creation errors do not reflect on  the standard of medical care.

## 2018-02-08 ENCOUNTER — Ambulatory Visit (INDEPENDENT_AMBULATORY_CARE_PROVIDER_SITE_OTHER): Payer: Self-pay

## 2018-02-08 ENCOUNTER — Encounter: Payer: Self-pay | Admitting: Rheumatology

## 2018-02-08 ENCOUNTER — Ambulatory Visit: Payer: PPO | Admitting: Rheumatology

## 2018-02-08 VITALS — BP 146/69 | HR 58 | Resp 18 | Ht 61.0 in | Wt 220.2 lb

## 2018-02-08 DIAGNOSIS — M19012 Primary osteoarthritis, left shoulder: Secondary | ICD-10-CM | POA: Diagnosis not present

## 2018-02-08 DIAGNOSIS — Z96653 Presence of artificial knee joint, bilateral: Secondary | ICD-10-CM | POA: Diagnosis not present

## 2018-02-08 DIAGNOSIS — Z8639 Personal history of other endocrine, nutritional and metabolic disease: Secondary | ICD-10-CM | POA: Diagnosis not present

## 2018-02-08 DIAGNOSIS — R7303 Prediabetes: Secondary | ICD-10-CM | POA: Diagnosis not present

## 2018-02-08 DIAGNOSIS — Z79899 Other long term (current) drug therapy: Secondary | ICD-10-CM

## 2018-02-08 DIAGNOSIS — M79672 Pain in left foot: Secondary | ICD-10-CM

## 2018-02-08 DIAGNOSIS — I83893 Varicose veins of bilateral lower extremities with other complications: Secondary | ICD-10-CM

## 2018-02-08 DIAGNOSIS — I10 Essential (primary) hypertension: Secondary | ICD-10-CM | POA: Diagnosis not present

## 2018-02-08 DIAGNOSIS — M79641 Pain in right hand: Secondary | ICD-10-CM | POA: Diagnosis not present

## 2018-02-08 DIAGNOSIS — M79642 Pain in left hand: Secondary | ICD-10-CM | POA: Diagnosis not present

## 2018-02-08 DIAGNOSIS — G894 Chronic pain syndrome: Secondary | ICD-10-CM

## 2018-02-08 DIAGNOSIS — Z8709 Personal history of other diseases of the respiratory system: Secondary | ICD-10-CM

## 2018-02-08 DIAGNOSIS — M5136 Other intervertebral disc degeneration, lumbar region: Secondary | ICD-10-CM

## 2018-02-08 DIAGNOSIS — M79671 Pain in right foot: Secondary | ICD-10-CM

## 2018-02-08 DIAGNOSIS — R768 Other specified abnormal immunological findings in serum: Secondary | ICD-10-CM

## 2018-02-08 DIAGNOSIS — R5383 Other fatigue: Secondary | ICD-10-CM | POA: Diagnosis not present

## 2018-02-08 DIAGNOSIS — R682 Dry mouth, unspecified: Secondary | ICD-10-CM | POA: Diagnosis not present

## 2018-02-08 DIAGNOSIS — Z853 Personal history of malignant neoplasm of breast: Secondary | ICD-10-CM | POA: Diagnosis not present

## 2018-02-09 NOTE — Progress Notes (Signed)
Possible UTI.  Patient should see her PCP.

## 2018-02-14 LAB — PROTEIN ELECTROPHORESIS, SERUM, WITH REFLEX
ALPHA 2: 0.7 g/dL (ref 0.5–0.9)
Albumin ELP: 3.9 g/dL (ref 3.8–4.8)
Alpha 1: 0.3 g/dL (ref 0.2–0.3)
BETA 2: 0.4 g/dL (ref 0.2–0.5)
BETA GLOBULIN: 0.4 g/dL (ref 0.4–0.6)
GAMMA GLOBULIN: 1.3 g/dL (ref 0.8–1.7)
Total Protein: 7.1 g/dL (ref 6.1–8.1)

## 2018-02-14 LAB — URINALYSIS, ROUTINE W REFLEX MICROSCOPIC
BILIRUBIN URINE: NEGATIVE
Glucose, UA: NEGATIVE
HGB URINE DIPSTICK: NEGATIVE
Hyaline Cast: NONE SEEN /LPF
KETONES UR: NEGATIVE
NITRITE: NEGATIVE
PROTEIN: NEGATIVE
RBC / HPF: NONE SEEN /HPF (ref 0–2)
Specific Gravity, Urine: 1.015 (ref 1.001–1.03)
pH: 7.5 (ref 5.0–8.0)

## 2018-02-14 LAB — HEPATITIS C ANTIBODY
Hepatitis C Ab: NONREACTIVE
SIGNAL TO CUT-OFF: 0.02 (ref <1.00)

## 2018-02-14 LAB — LUPUS ANTICOAGULANT EVAL W/ REFLEX
PTT-LA Screen: 38 s (ref <=40)
dRVVT: 44 s (ref <=45)

## 2018-02-14 LAB — CBC WITH DIFFERENTIAL/PLATELET
BASOS ABS: 28 {cells}/uL (ref 0–200)
Basophils Relative: 0.6 %
EOS ABS: 118 {cells}/uL (ref 15–500)
Eosinophils Relative: 2.5 %
HCT: 39 % (ref 35.0–45.0)
HEMOGLOBIN: 13.3 g/dL (ref 11.7–15.5)
Lymphs Abs: 1283 cells/uL (ref 850–3900)
MCH: 30.4 pg (ref 27.0–33.0)
MCHC: 34.1 g/dL (ref 32.0–36.0)
MCV: 89.2 fL (ref 80.0–100.0)
MONOS PCT: 8.1 %
MPV: 9.9 fL (ref 7.5–12.5)
NEUTROS ABS: 2891 {cells}/uL (ref 1500–7800)
NEUTROS PCT: 61.5 %
Platelets: 213 10*3/uL (ref 140–400)
RBC: 4.37 10*6/uL (ref 3.80–5.10)
RDW: 12.7 % (ref 11.0–15.0)
TOTAL LYMPHOCYTE: 27.3 %
WBC mixed population: 381 cells/uL (ref 200–950)
WBC: 4.7 10*3/uL (ref 3.8–10.8)

## 2018-02-14 LAB — CARDIOLIPIN ANTIBODIES, IGG, IGM, IGA
ANTICARDIOLIPIN IGM: 58 [MPL'U] — AB
Anticardiolipin IgG: <14 [GPL'U]

## 2018-02-14 LAB — CK: CK TOTAL: 98 U/L (ref 29–143)

## 2018-02-14 LAB — HEPATITIS B SURFACE ANTIGEN: Hepatitis B Surface Ag: NONREACTIVE

## 2018-02-14 LAB — COMPLETE METABOLIC PANEL WITH GFR
AG Ratio: 1.5 (calc) (ref 1.0–2.5)
ALBUMIN MSPROF: 4.3 g/dL (ref 3.6–5.1)
ALT: 14 U/L (ref 6–29)
AST: 18 U/L (ref 10–35)
Alkaline phosphatase (APISO): 98 U/L (ref 33–130)
BILIRUBIN TOTAL: 0.6 mg/dL (ref 0.2–1.2)
BUN: 25 mg/dL (ref 7–25)
CO2: 28 mmol/L (ref 20–32)
Calcium: 9.2 mg/dL (ref 8.6–10.4)
Chloride: 99 mmol/L (ref 98–110)
Creat: 0.83 mg/dL (ref 0.60–0.93)
GFR, Est African American: 78 mL/min/{1.73_m2} (ref >=60)
GFR, Est Non African American: 68 mL/min/{1.73_m2} (ref >=60)
GLOBULIN: 2.8 g/dL (ref 1.9–3.7)
GLUCOSE: 102 mg/dL — AB (ref 65–99)
Potassium: 4.3 mmol/L (ref 3.5–5.3)
SODIUM: 136 mmol/L (ref 135–146)
Total Protein: 7.1 g/dL (ref 6.1–8.1)

## 2018-02-14 LAB — ANTI-NUCLEAR AB-TITER (ANA TITER): ANA Titer 1: >=1:1280 {titer} — AB

## 2018-02-14 LAB — QUANTIFERON-TB GOLD PLUS
Mitogen-NIL: >10 IU/mL
NIL: 0.13 IU/mL
QUANTIFERON-TB GOLD PLUS: NEGATIVE
TB1-NIL: 0 IU/mL
TB2-NIL: <0 IU/mL

## 2018-02-14 LAB — BETA-2 GLYCOPROTEIN ANTIBODIES

## 2018-02-14 LAB — ANA: ANA: POSITIVE — AB

## 2018-02-14 LAB — IGG, IGA, IGM
IGG (IMMUNOGLOBIN G), SERUM: 1400 mg/dL (ref 600–1540)
IGM, SERUM: 173 mg/dL (ref 50–300)
Immunoglobulin A: 176 mg/dL (ref 20–320)

## 2018-02-14 LAB — HIV ANTIBODY (ROUTINE TESTING W REFLEX): HIV 1&2 Ab, 4th Generation: NONREACTIVE

## 2018-02-14 LAB — IFE INTERPRETATION

## 2018-02-14 LAB — SEDIMENTATION RATE: SED RATE: 36 mm/h — AB (ref 0–30)

## 2018-02-14 LAB — GLUCOSE 6 PHOSPHATE DEHYDROGENASE: G-6PDH: 14.9 U/g{Hb} (ref 7.0–20.5)

## 2018-02-14 LAB — HEPATITIS B CORE ANTIBODY, IGM: HEP B C IGM: NONREACTIVE

## 2018-02-14 NOTE — Progress Notes (Signed)
She will need referral to hematology for abnormal IFE.  Please see if patient can be seen earlier than October 31.  There are some openings in the first week of September.

## 2018-02-15 ENCOUNTER — Telehealth: Payer: Self-pay | Admitting: *Deleted

## 2018-02-15 DIAGNOSIS — R899 Unspecified abnormal finding in specimens from other organs, systems and tissues: Secondary | ICD-10-CM

## 2018-02-15 DIAGNOSIS — Z6837 Body mass index (BMI) 37.0-37.9, adult: Secondary | ICD-10-CM | POA: Diagnosis not present

## 2018-02-15 DIAGNOSIS — R300 Dysuria: Secondary | ICD-10-CM | POA: Diagnosis not present

## 2018-02-15 DIAGNOSIS — L309 Dermatitis, unspecified: Secondary | ICD-10-CM | POA: Diagnosis not present

## 2018-02-15 NOTE — Telephone Encounter (Signed)
-----   Message from Bo Merino, MD sent at 02/14/2018 12:39 PM EDT ----- She will need referral to hematology for abnormal IFE.  Please see if patient can be seen earlier than October 31.  There are some openings in the first week of September.

## 2018-03-15 DIAGNOSIS — Z2821 Immunization not carried out because of patient refusal: Secondary | ICD-10-CM | POA: Diagnosis not present

## 2018-03-15 DIAGNOSIS — R682 Dry mouth, unspecified: Secondary | ICD-10-CM | POA: Diagnosis not present

## 2018-03-15 DIAGNOSIS — Z0001 Encounter for general adult medical examination with abnormal findings: Secondary | ICD-10-CM | POA: Diagnosis not present

## 2018-03-15 DIAGNOSIS — H04129 Dry eye syndrome of unspecified lacrimal gland: Secondary | ICD-10-CM | POA: Diagnosis not present

## 2018-03-15 DIAGNOSIS — G4700 Insomnia, unspecified: Secondary | ICD-10-CM | POA: Diagnosis not present

## 2018-03-15 DIAGNOSIS — Z7409 Other reduced mobility: Secondary | ICD-10-CM | POA: Diagnosis not present

## 2018-03-15 DIAGNOSIS — Z6837 Body mass index (BMI) 37.0-37.9, adult: Secondary | ICD-10-CM | POA: Diagnosis not present

## 2018-03-15 DIAGNOSIS — I10 Essential (primary) hypertension: Secondary | ICD-10-CM | POA: Diagnosis not present

## 2018-03-15 DIAGNOSIS — G894 Chronic pain syndrome: Secondary | ICD-10-CM | POA: Diagnosis not present

## 2018-03-15 DIAGNOSIS — R21 Rash and other nonspecific skin eruption: Secondary | ICD-10-CM | POA: Diagnosis not present

## 2018-03-15 DIAGNOSIS — M1990 Unspecified osteoarthritis, unspecified site: Secondary | ICD-10-CM | POA: Diagnosis not present

## 2018-03-21 ENCOUNTER — Ambulatory Visit: Payer: PPO | Admitting: Rheumatology

## 2018-04-07 ENCOUNTER — Encounter (HOSPITAL_COMMUNITY): Payer: Self-pay

## 2018-04-14 DIAGNOSIS — I10 Essential (primary) hypertension: Secondary | ICD-10-CM | POA: Diagnosis not present

## 2018-04-14 DIAGNOSIS — E039 Hypothyroidism, unspecified: Secondary | ICD-10-CM | POA: Diagnosis not present

## 2018-04-14 DIAGNOSIS — Z853 Personal history of malignant neoplasm of breast: Secondary | ICD-10-CM | POA: Diagnosis not present

## 2018-04-14 DIAGNOSIS — M1990 Unspecified osteoarthritis, unspecified site: Secondary | ICD-10-CM | POA: Diagnosis not present

## 2018-04-14 DIAGNOSIS — E782 Mixed hyperlipidemia: Secondary | ICD-10-CM | POA: Diagnosis not present

## 2018-04-22 DIAGNOSIS — M5136 Other intervertebral disc degeneration, lumbar region: Secondary | ICD-10-CM | POA: Insufficient documentation

## 2018-04-22 DIAGNOSIS — M3501 Sicca syndrome with keratoconjunctivitis: Secondary | ICD-10-CM | POA: Insufficient documentation

## 2018-04-22 DIAGNOSIS — M19041 Primary osteoarthritis, right hand: Secondary | ICD-10-CM | POA: Insufficient documentation

## 2018-04-22 DIAGNOSIS — M19012 Primary osteoarthritis, left shoulder: Secondary | ICD-10-CM | POA: Insufficient documentation

## 2018-04-22 DIAGNOSIS — M51369 Other intervertebral disc degeneration, lumbar region without mention of lumbar back pain or lower extremity pain: Secondary | ICD-10-CM | POA: Insufficient documentation

## 2018-04-22 DIAGNOSIS — M19071 Primary osteoarthritis, right ankle and foot: Secondary | ICD-10-CM | POA: Insufficient documentation

## 2018-04-22 DIAGNOSIS — M19072 Primary osteoarthritis, left ankle and foot: Secondary | ICD-10-CM

## 2018-04-22 DIAGNOSIS — M19042 Primary osteoarthritis, left hand: Secondary | ICD-10-CM

## 2018-04-22 NOTE — Progress Notes (Deleted)
Office Visit Note  Patient: Teresa Anderson             Date of Birth: 1941-06-03           MRN: [id number]             PCP: Celene Squibb, MD Referring: Celene Squibb, MD Visit Date: 04/28/2018 Occupation: _0 @  Subjective:  No chief complaint on file.   History of Present Illness: Lorilynn Lehr is a 77 y.o. female ***   Activities of Daily Living:  Patient reports morning stiffness for *** {minute/hour:19697}.   Patient {ACTIONS;DENIES/REPORTS:21021675::"Denies"} nocturnal pain.  Difficulty dressing/grooming: {ACTIONS;DENIES/REPORTS:21021675::"Denies"} Difficulty climbing stairs: {ACTIONS;DENIES/REPORTS:21021675::"Denies"} Difficulty getting out of chair: {ACTIONS;DENIES/REPORTS:21021675::"Denies"} Difficulty using hands for taps, buttons, cutlery, and/or writing: {ACTIONS;DENIES/REPORTS:21021675::"Denies"}  No Rheumatology ROS completed.   PMFS History:  Patient Active Problem List   Diagnosis Date Noted  . Sjogren's syndrome with keratoconjunctivitis sicca ([place]) 04/22/2018  . Primary osteoarthritis of both hands 04/22/2018  . Primary osteoarthritis of both feet 04/22/2018  . DDD (degenerative disc disease), lumbar 04/22/2018  . Primary osteoarthritis of left shoulder 04/22/2018  . Abdominal hernia without obstruction or gangrene 10/12/2016  . Acute pain of left shoulder 10/08/2016  . Varicose veins of bilateral lower extremities with other complications 76/73/4193  . Essential hypertension 03/16/2016  . Lymphedema 03/16/2016  . Breast cancer in female [facility]) 03/16/2016  . Hypothyroidism 03/16/2016  . 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 Prediabetes 03/16/2016  . Obesity 03/16/2016  . Hyperlipidemia 03/16/2016  . Chronic pain syndrome 03/16/2016    Past Medical History:  Diagnosis Date  . Allergy   . Arthritis   . Asthma   . Blood transfusion without reported diagnosis    with cancer surgery  . Cancer ([place])    breast and skin  . Diabetes mellitus without complication ([place])   . Hyperlipidemia   . Hypertension   . Lymphedema of leg   . Thyroid disease   . Varicose veins of bilateral lower extremities with other complications     Family History  Problem Relation Age of Onset  . Heart disease Mother 21       heart attack  . COPD Father        former smoker  . Pancreatic cancer Father   . Celiac disease Daughter   . Cancer Maternal Aunt        lung-smoker   Past Surgical History:  Procedure Laterality Date  . ABDOMINAL HYSTERECTOMY    . BREAST SURGERY     bilat  . CESAREAN SECTION     x4  . CHOLECYSTECTOMY    . COSMETIC SURGERY     reconstruction  . MASTECTOMY     bilateral   Social History   Social History Narrative  . Not on file    Objective: Vital Signs: There were no vitals taken for this visit.   Physical Exam   Musculoskeletal Exam: ***  CDAI Exam: CDAI Score: Not documented Patient Global Assessment: Not documented; Provider Global Assessment: Not documented Swollen: Not documented; Tender: Not documented Joint Exam   Not documented   There is currently no information documented on the homunculus. Go to the Rheumatology activity and complete the homunculus joint exam.  Investigation: No additional findings.  Imaging: No results found.  Recent Labs: Lab Results  Component Value Date   WBC 4.7 02/08/2018   HGB 13.3 02/08/2018   PLT 213 02/08/2018  NA 136 02/08/2018   K 4.3 02/08/2018   CL 99 02/08/2018   CO2 28 02/08/2018   GLUCOSE 102 (H) 02/08/2018   BUN 25 02/08/2018   CREATININE 0.83 02/08/2018   BILITOT 0.6 02/08/2018   ALKPHOS 86 12/14/2016   AST 18 02/08/2018   ALT 14 02/08/2018   PROT 7.1 02/08/2018   PROT 7.1 02/08/2018   ALBUMIN 4.1 12/14/2016   CALCIUM 9.2 02/08/2018   GFRAA 78 02/08/2018   QFTBGOLDPLUS NEGATIVE 02/08/2018  UA 2+ leukocytes and few bacteria, IFE revealed poorly defined  area of restricted protein mobility reactive with IgG and kappa antisera, G6PD normal, HIV negative, hepatitis B-, hepatitis C negative, immunoglobulins normal, TB Gold negative, CK 98 ESR 36, ANA greater than 1: 1280NS, anticardiolipin negative, beta-2 GP 1-, lupus anticoagulant negative  Speciality Comments: No specialty comments available.  Procedures:  No procedures performed Allergies: Penicillins; Sulfa antibiotics; Demerol [meperidine]; and Morphine and related   Assessment / Plan:     Visit Diagnoses: Sjogren's syndrome with keratoconjunctivitis sicca ([facility]) - ANA> 1:1280 NS, SSA positive, SSB positive, RF positive, history of dry mouth.  Primary osteoarthritis of both hands  Primary osteoarthritis of both feet  History of bilateral knee replacement  DDD (degenerative disc disease), lumbar  Primary osteoarthritis of left shoulder - Followed up by Dr. Mardelle Matte  Chronic pain syndrome - On oxycodone 10 mg 3 times daily as needed   Other medical problems are listed as follows:  Essential hypertension  Prediabetes  History of breast cancer - Bilateral masectomy with reconstruction 2001  History of hypothyroidism  History of hyperlipidemia  Varicose veins of bilateral lower extremities with other complications  History of asthma  Orders: No orders of the defined types were placed in this encounter.  No orders of the defined types were placed in this encounter.   Face-to-face time spent with patient was *** minutes. Greater than 50% of time was spent in counseling and coordination of care.  Follow-Up Instructions: No follow-ups on file.   Bo Merino, MD  Note - This record has been created using Editor, commissioning.  Chart creation errors have been sought, but may not always  have been located. Such creation errors do not reflect on  the standard of medical care.

## 2018-04-28 ENCOUNTER — Ambulatory Visit: Payer: PPO | Admitting: Rheumatology

## 2018-05-03 DIAGNOSIS — Z853 Personal history of malignant neoplasm of breast: Secondary | ICD-10-CM | POA: Diagnosis not present

## 2018-05-03 DIAGNOSIS — E782 Mixed hyperlipidemia: Secondary | ICD-10-CM | POA: Diagnosis not present

## 2018-05-03 DIAGNOSIS — I10 Essential (primary) hypertension: Secondary | ICD-10-CM | POA: Diagnosis not present

## 2018-05-03 DIAGNOSIS — E039 Hypothyroidism, unspecified: Secondary | ICD-10-CM | POA: Diagnosis not present

## 2018-05-03 DIAGNOSIS — M1990 Unspecified osteoarthritis, unspecified site: Secondary | ICD-10-CM | POA: Diagnosis not present

## 2018-05-23 DIAGNOSIS — J029 Acute pharyngitis, unspecified: Secondary | ICD-10-CM | POA: Diagnosis not present

## 2018-05-23 DIAGNOSIS — H5712 Ocular pain, left eye: Secondary | ICD-10-CM | POA: Diagnosis not present

## 2018-06-07 DIAGNOSIS — S30811A Abrasion of abdominal wall, initial encounter: Secondary | ICD-10-CM | POA: Diagnosis not present

## 2018-11-28 DIAGNOSIS — G894 Chronic pain syndrome: Secondary | ICD-10-CM | POA: Diagnosis not present

## 2018-11-28 DIAGNOSIS — M25551 Pain in right hip: Secondary | ICD-10-CM | POA: Diagnosis not present

## 2018-11-28 DIAGNOSIS — M7918 Myalgia, other site: Secondary | ICD-10-CM | POA: Diagnosis not present

## 2018-11-28 DIAGNOSIS — R760 Raised antibody titer: Secondary | ICD-10-CM | POA: Diagnosis not present

## 2018-11-28 DIAGNOSIS — M20099 Other deformity of finger(s), unspecified finger(s): Secondary | ICD-10-CM | POA: Diagnosis not present

## 2018-12-02 DIAGNOSIS — M67441 Ganglion, right hand: Secondary | ICD-10-CM | POA: Diagnosis not present

## 2018-12-02 DIAGNOSIS — M79644 Pain in right finger(s): Secondary | ICD-10-CM | POA: Diagnosis not present

## 2018-12-03 DIAGNOSIS — M67449 Ganglion, unspecified hand: Secondary | ICD-10-CM | POA: Insufficient documentation

## 2018-12-14 DIAGNOSIS — I890 Lymphedema, not elsewhere classified: Secondary | ICD-10-CM | POA: Diagnosis not present

## 2018-12-14 DIAGNOSIS — R760 Raised antibody titer: Secondary | ICD-10-CM | POA: Diagnosis not present

## 2018-12-14 DIAGNOSIS — M20099 Other deformity of finger(s), unspecified finger(s): Secondary | ICD-10-CM | POA: Diagnosis not present

## 2018-12-14 DIAGNOSIS — M25551 Pain in right hip: Secondary | ICD-10-CM | POA: Diagnosis not present

## 2018-12-14 DIAGNOSIS — M797 Fibromyalgia: Secondary | ICD-10-CM | POA: Diagnosis not present

## 2018-12-15 DIAGNOSIS — E039 Hypothyroidism, unspecified: Secondary | ICD-10-CM | POA: Diagnosis not present

## 2018-12-15 DIAGNOSIS — I10 Essential (primary) hypertension: Secondary | ICD-10-CM | POA: Diagnosis not present

## 2018-12-15 DIAGNOSIS — R600 Localized edema: Secondary | ICD-10-CM | POA: Diagnosis not present

## 2018-12-15 DIAGNOSIS — E782 Mixed hyperlipidemia: Secondary | ICD-10-CM | POA: Diagnosis not present

## 2018-12-19 DIAGNOSIS — M5136 Other intervertebral disc degeneration, lumbar region: Secondary | ICD-10-CM | POA: Diagnosis not present

## 2018-12-19 DIAGNOSIS — M5416 Radiculopathy, lumbar region: Secondary | ICD-10-CM | POA: Diagnosis not present

## 2018-12-30 DIAGNOSIS — I10 Essential (primary) hypertension: Secondary | ICD-10-CM | POA: Diagnosis not present

## 2018-12-30 DIAGNOSIS — R600 Localized edema: Secondary | ICD-10-CM | POA: Diagnosis not present

## 2018-12-30 DIAGNOSIS — E782 Mixed hyperlipidemia: Secondary | ICD-10-CM | POA: Diagnosis not present

## 2018-12-30 DIAGNOSIS — E039 Hypothyroidism, unspecified: Secondary | ICD-10-CM | POA: Diagnosis not present

## 2019-01-04 DIAGNOSIS — M20099 Other deformity of finger(s), unspecified finger(s): Secondary | ICD-10-CM | POA: Diagnosis not present

## 2019-01-04 DIAGNOSIS — I890 Lymphedema, not elsewhere classified: Secondary | ICD-10-CM | POA: Diagnosis not present

## 2019-01-04 DIAGNOSIS — R760 Raised antibody titer: Secondary | ICD-10-CM | POA: Diagnosis not present

## 2019-01-04 DIAGNOSIS — M797 Fibromyalgia: Secondary | ICD-10-CM | POA: Diagnosis not present

## 2019-01-04 DIAGNOSIS — M25551 Pain in right hip: Secondary | ICD-10-CM | POA: Diagnosis not present

## 2019-01-25 DIAGNOSIS — L299 Pruritus, unspecified: Secondary | ICD-10-CM | POA: Diagnosis not present

## 2019-01-25 DIAGNOSIS — R760 Raised antibody titer: Secondary | ICD-10-CM | POA: Diagnosis not present

## 2019-01-25 DIAGNOSIS — M20099 Other deformity of finger(s), unspecified finger(s): Secondary | ICD-10-CM | POA: Diagnosis not present

## 2019-01-25 DIAGNOSIS — M797 Fibromyalgia: Secondary | ICD-10-CM | POA: Diagnosis not present

## 2019-01-25 DIAGNOSIS — I890 Lymphedema, not elsewhere classified: Secondary | ICD-10-CM | POA: Diagnosis not present

## 2019-01-27 ENCOUNTER — Encounter (HOSPITAL_COMMUNITY): Payer: Self-pay | Admitting: Physical Therapy

## 2019-01-27 ENCOUNTER — Ambulatory Visit (HOSPITAL_COMMUNITY): Payer: Medicare Other | Attending: Physical Medicine and Rehabilitation | Admitting: Physical Therapy

## 2019-01-27 ENCOUNTER — Other Ambulatory Visit: Payer: Self-pay

## 2019-01-27 DIAGNOSIS — R29898 Other symptoms and signs involving the musculoskeletal system: Secondary | ICD-10-CM | POA: Diagnosis not present

## 2019-01-27 DIAGNOSIS — M5442 Lumbago with sciatica, left side: Secondary | ICD-10-CM | POA: Insufficient documentation

## 2019-01-27 DIAGNOSIS — M6281 Muscle weakness (generalized): Secondary | ICD-10-CM | POA: Insufficient documentation

## 2019-01-27 DIAGNOSIS — M5441 Lumbago with sciatica, right side: Secondary | ICD-10-CM | POA: Insufficient documentation

## 2019-01-27 DIAGNOSIS — G8929 Other chronic pain: Secondary | ICD-10-CM

## 2019-01-27 DIAGNOSIS — R2681 Unsteadiness on feet: Secondary | ICD-10-CM | POA: Diagnosis not present

## 2019-01-27 DIAGNOSIS — R262 Difficulty in walking, not elsewhere classified: Secondary | ICD-10-CM | POA: Diagnosis not present

## 2019-01-27 NOTE — Patient Instructions (Signed)
MEDITATION  Get into a comfortable position.  Close your eyes and take long, deep breaths so that your belly fills up with air.  Try to focus on your breathing. If you find your mind wandering, bring it back to your breath.  Set a timer for 2 minutes at first, and progress how long you are doing it as this becomes easier.  Repeat 3-5 times per day and especially right before bed.      Transverse Abdominus Activation  Lying on your back, pull your bellybutton into your spine.   Hold for a count of 3, then relax.  Repeat 10 times, 3 times a day.     SEATED MARCHING  While seated in a chair, lift up your foot and knee, set it down and then perform on the other leg.   Make sure you are squeezing your core tightly.  If you have pain in your back, do not lift your leg as high.  Repeat 5 times each leg, 2-3 times per day.

## 2019-01-27 NOTE — Therapy (Addendum)
[place], [place], [address] Phone: [phone]   Fax:  [phone]  Physical Therapy Evaluation  Patient Details  Name: Teresa Anderson MRN: [id number] Date of Birth: 1940-08-05 Referring Provider (PT): Suella Broad    Encounter Date: 01/27/2019  PT End of Session - 01/27/19 1415    Visit Number  1    Number of Visits  7    Date for PT Re-Evaluation  02/17/19    Authorization Type  [employer] Medicare    Authorization Time Period  01/27/19 to 02/17/19    Authorization - Visit Number  1    Authorization - Number of Visits  10    PT Start Time  1318    PT Stop Time  1400    PT Time Calculation (min)  42 min    Activity Tolerance  Patient limited by pain    Behavior During Therapy  [facility] for tasks assessed/performed       Past Medical History:  Diagnosis Date  . Allergy   . Arthritis   . Asthma   . Blood transfusion without reported diagnosis    with cancer surgery  . Cancer ([place])    breast and skin  . Diabetes mellitus without complication ([place])   . Hyperlipidemia   . Hypertension   . Lymphedema of leg   . Thyroid disease   . Varicose veins of bilateral lower extremities with other complications     Past Surgical History:  Procedure Laterality Date  . ABDOMINAL HYSTERECTOMY    . BREAST SURGERY     bilat  . CESAREAN SECTION     x4  . CHOLECYSTECTOMY    . COSMETIC SURGERY     reconstruction  . MASTECTOMY     bilateral    There were no vitals filed for this visit.   Subjective Assessment - 01/27/19 1320    Subjective  I have pain in my back, legs, and shoulder. It has been around for years. It seems like it its getting worse. Driving, sitting to sew or read, vacuum, breathing all make it worse, laying in bed is hard because I get up and I can barely walk. Pain goes down my legs. No falls but I have had some trips. Urgency of bowel and bladder but she reports this has been this way for awhile, no  saddle parasthesia.    How long can you sit comfortably?  unsure but cannot drive for 2 hours    How long can you stand comfortably?  immediate pain    How long can you walk comfortably?  not long (did not give a distance)    Patient Stated Goals  ease pain    Currently in Pain?  Yes    Pain Score  9     Pain Location  Back    Pain Orientation  Lower;Medial    Pain Descriptors / Indicators  Aching;Other (Comment)   my bone marrow aches and my back feels separate like I can't move   Pain Type  Chronic pain    Pain Radiating Towards  all the way to my feet    Pain Onset  More than a month ago    Pain Frequency  Constant    Aggravating Factors   everything even breathing    Pain Relieving Factors  nothing    Effect of Pain on Daily Activities  severe         [facility] PT Assessment - 01/27/19  0001      Assessment   Medical Diagnosis  back pain     Referring Provider (PT)  Suella Broad     Onset Date/Surgical Date  --   chronic    Next MD Visit  unscheduled     Prior Therapy  3-4 years ago       Precautions   Precautions  None      Restrictions   Weight Bearing Restrictions  No      Balance Screen   Has the patient fallen in the past 6 months  No    Has the patient had a decrease in activity level because of a fear of falling?   No    Is the patient reluctant to leave their home because of a fear of falling?   No      Home Film/video editor residence      Prior Function   Level of Independence  Independent;Independent with basic ADLs;Independent with household mobility without device    Vocation Requirements  substitute teacher      Posture/Postural Control   Posture/Postural Control  Postural limitations    Postural Limitations  Rounded Shoulders;Forward head;Flexed trunk      AROM   Lumbar Flexion  unable to isolate lumbar flexion from knee flexion     Lumbar Extension  severe limitation     Lumbar - Right Side Bend  moderate limitation      Lumbar - Left Side Bend  extreme limitation     Thoracic Flexion  mild liimtation     Thoracic Extension  severe limitation     Thoracic - Right Side Bend  moderate liimtation     Thoracic - Left Side Bend  moderate limitation     Thoracic - Right Rotation  mild limitation     Thoracic - Left Rotation  mild limitation       Strength   Right Hip Flexion  3/5    Left Hip Flexion  3/5    Right Knee Extension  3/5    Left Knee Extension  3/5    Right Ankle Dorsiflexion  5/5    Left Ankle Dorsiflexion  5/5      Palpation   Palpation comment  unable to identify areas of soreness/tenderness in low back; bilateral gluteal and pirfiformis       Ambulation/Gait   Ambulation/Gait  Yes    Gait Pattern  Step-through pattern;Decreased arm swing - right;Decreased arm swing - left;Decreased dorsiflexion - right;Decreased dorsiflexion - left;Trendelenburg;Antalgic;Decreased trunk rotation;Trunk flexed    Gait Comments  patient wearing loose dress at eval which limited gait assessment                 Objective measurements completed on examination: See above findings.              PT Education - 01/27/19 1413    Education Details  extensive education on the role of poor sleep and stress/anxiety/dis-satisfaction with life and intensity of pain; benefits of meditation; basic core exercise HEP in sitting; POC moving forward    Person(s) Educated  Patient    Methods  Explanation    Comprehension  Verbalized understanding;Need further instruction       PT Short Term Goals - 01/27/19 1426      PT SHORT TERM GOAL #1   Title  Patient to be compliant with progressive meditation routine in order to assist in reducing anxiety and psychosocial impact of  lumbar pain    Time  3    Period  Weeks    Status  New    Target Date  02/17/19      PT SHORT TERM GOAL #2   Title  Patient to be compliant with sleep hygiene regimen in order to improve sleep and promote physiological/biochemical  components of sleep that work to reduce pain and inflammation    Time  3    Period  Weeks    Status  New      PT SHORT TERM GOAL #3   Title  Patient to report she has been able to sleep at least 3 hours without waking due to pain in order to show improved quality of life    Time  3    Period  Weeks    Status  New      PT SHORT TERM GOAL #4   Title  Patient to demonstrate only moderate restrictions in lumbar mobility and consistent ability to perform sustained TA set during exercise in order to show improving tolerance to gross activity    Time  3    Period  Weeks    Status  New        PT Long Term Goals - 01/27/19 1437      PT LONG TERM GOAL #1   Title  No LTGs appropriate at this time- need to assess compliance with therapy and willingness to continue with therapy prior to extending visits/cert             Plan - 01/27/19 1416    Clinical Impression Statement  Teresa Anderson arrives reporting pain so significant in her back, legs, and shoulders that even breathing is excruciating for her; she can find no comfortable position and she is in constant pain. She reports frustration at moving from [place] to [place] as she feels there is "nothing in this area and my friends would not recognize me physically now", as well as poor relations with one of her daughters who lives in [place]. 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 She is constantly moving during this exam due to pain, and frequently describes stressful situations in her life yet denies feeling anxious or stressed. Examination was extremely limited due to severe intensity and quick irritability of patient's pain, however did identify impaired lumbar and thoracic mobility, severe functional muscle weakness, postural impairment, gait deviation, and impaired functional task tolerance  based on limited objective measures and skilled clinical assessment of mobility patterns. Had in depth discussion regarding importance of sleep hygiene in helping to reduce pain, as well as possible benefits of meditation in helping to reduce whole body and psychological stress and thereby helping to physiologically reduce pain. Assigned basic meditation routine and very low level seated core exercises today. Feel that a great deal of patient's pain is possibly driven by psychosocial factors including poor sleep patterns and sleep hygiene, anxiety and stress, and dis-satisfaction with her current living situation. Recommend trial of skilled PT services to be focused on promotion of gentle mobility, pain neuroscience education, sleep hygiene, and functional exercise training as tolerated.    Personal Factors and Comorbidities  Age;Behavior Pattern;Fitness;Past/Current Experience;Comorbidity 3+;Sex;Social Background;Education;Finances;Time since onset of injury/illness/exacerbation    Comorbidities  obesity, chronic pain syndrome, DM, Sjogren's syndrome    Examination-Activity Limitations  Bathing;Locomotion Level;Transfers;Bed Mobility;Reach Overhead;Bend;Self Feeding;Caring  for Others;Sit;Carry;Sleep;Continence;Squat;Dressing;Stairs;Hygiene/Grooming;Stand;Toileting;Lift    Examination-Participation Restrictions  Church;Yard Work;Cleaning;Meal Prep;Community Activity;Personal Finances;Driving;School;Interpersonal Relationship;Shop;Laundry;Volunteer    Stability/Clinical Decision Making  Evolving/Moderate complexity    Clinical Decision Making  Moderate    Rehab Potential  Fair    PT Frequency  2x / week    PT Duration  3 weeks    PT Treatment/Interventions  ADLs/Self Care Home Management;Biofeedback;Cryotherapy;Electrical Stimulation;Iontophoresis 4mg /ml Dexamethasone;Moist Heat;Traction;Ultrasound;Gait training;Stair training;Functional mobility training;Therapeutic activities;Therapeutic exercise;Balance  training;Neuromuscular re-education;Patient/family education;Manual techniques;Dry needling;Energy conservation;Taping;Visual/perceptual remediation/compensation;Joint Manipulations;Spinal Manipulations    PT Next Visit Plan  Please give [place] sleep hygiene handout. Review TA sets and TA marching, review meditation. Continue to work with pain neuroscience education and promote meditation. Discuss sleep hygiene in more depth. Gentle exercises in sitting with heat and/or TENS. She is not a candidate for tough love, really needs pain science ed and exercise/mobility only as tolerated.    PT Home Exercise Plan  Eval: meditation plan, TA sets seated, TA marching seated    Consulted and Agree with Plan of Care  Patient       Patient will benefit from skilled therapeutic intervention in order to improve the following deficits and impairments:  Abnormal gait, Decreased coordination, Decreased range of motion, Difficulty walking, Increased fascial restricitons, Decreased safety awareness, Decreased endurance, Decreased activity tolerance, Impaired perceived functional ability, Obesity, Pain, Decreased balance, Hypomobility, Impaired flexibility, Improper body mechanics, Decreased mobility, Decreased strength, Postural dysfunction  Visit Diagnosis: 1. Chronic bilateral low back pain with bilateral sciatica   2. Muscle weakness (generalized)   3. Difficulty in walking, not elsewhere classified   4. Unsteadiness on feet   5. Other symptoms and signs involving the musculoskeletal system        Problem List Patient Active Problem List   Diagnosis Date Noted  . Sjogren's syndrome with keratoconjunctivitis sicca ([place]) 04/22/2018  . Primary osteoarthritis of both hands 04/22/2018  . Primary osteoarthritis of both feet 04/22/2018  . DDD (degenerative disc disease), lumbar 04/22/2018  . Primary osteoarthritis of left shoulder 04/22/2018  . Abdominal hernia without obstruction or gangrene 10/12/2016  .  Acute pain of left shoulder 10/08/2016  . Varicose veins of bilateral lower extremities with other complications 56/81/2751  . Essential hypertension 03/16/2016  . Lymphedema 03/16/2016  . Breast cancer in female [facility]) 03/16/2016  . Hypothyroidism 03/16/2016  . 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 Prediabetes 03/16/2016  . Obesity 03/16/2016  . Hyperlipidemia 03/16/2016  . Chronic pain syndrome 03/16/2016    Deniece Ree PT, DPT, CBIS  Supplemental Physical Therapist [facility]    Pager [phone] Acute Rehab Office [place] [address] [place], [place], [address] Phone: [phone]   Fax:  [phone]  Name: Teresa Anderson MRN: [id number] Date of Birth: 04-15-1941

## 2019-01-31 ENCOUNTER — Ambulatory Visit (HOSPITAL_COMMUNITY): Payer: Medicare Other | Admitting: Physical Therapy

## 2019-02-01 ENCOUNTER — Telehealth (HOSPITAL_COMMUNITY): Payer: Self-pay | Admitting: Internal Medicine

## 2019-02-01 ENCOUNTER — Ambulatory Visit (HOSPITAL_COMMUNITY): Payer: Medicare Other

## 2019-02-01 ENCOUNTER — Other Ambulatory Visit: Payer: Self-pay

## 2019-02-01 NOTE — Telephone Encounter (Signed)
pt left a message to cx because she said that she was in a lot of pain and can hardly move  02/01/19

## 2019-02-01 NOTE — Patient Outreach (Addendum)
[facility]) Care Management  02/01/2019  Teresa Anderson Jul 13, 1940 [id number]   Telephone call to Dr. Juel Burrow office to discuss reason for referral.  Advised that referral was for palliative care for pain control.  CM advised that hospice would provide services for pain control.  Hospice referral was completed but unfortunately the hospice in that area does not have a palliative care program.  Teresa Anderson states that she will let the doctor know that.    Telephone call to [facility].  Spoke with [employer].  Advised her of the need for palliative care for patient.  She states they do services [facility] and would be glad to take referral.  Advised that CM would contact PCP office to advise to send the referral.  She verbalized understanding.    Spoke with Teresa Anderson at Dr. Juel Burrow office. Advised that [facility] does do Palliative Care in [place].  She verbalized understanding and thankful for the information.  [facility] information given to send referral to them.  She is thankful for the information and will let the doctor know.     1:45 pm: Spoke with patient. She is able to verify HIPAA. Discussed reason for the referral and [facility] services.  Advised patient that I did speak with Teresa Anderson at PCP's office and that Teresa Anderson would be forwarding the information to Dr. Nevada Crane for [facility].  Advised patient on what palliative care services are and how they could assist her.  Patient verbalized understanding .  CM discussed with patient in-depth how [facility] could support her.  Patient declined services at this time and is really looking for pain management at this point.  Advised patient that CM would send letter and brochure for future reference. She verbalized understanding and appreciative of the information.    Plan: RN CM will send letter and close case.    Teresa Baseman, RN, MSN [place] Management Care Management Coordinator Direct  Line [phone] Cell [phone] Toll Free: [phone]  Fax: [phone]

## 2019-02-02 ENCOUNTER — Ambulatory Visit (HOSPITAL_COMMUNITY): Payer: Medicare Other | Attending: Physical Medicine and Rehabilitation | Admitting: Physical Therapy

## 2019-02-02 ENCOUNTER — Other Ambulatory Visit: Payer: Self-pay

## 2019-02-02 ENCOUNTER — Encounter (HOSPITAL_COMMUNITY): Payer: Self-pay | Admitting: Physical Therapy

## 2019-02-02 DIAGNOSIS — M5441 Lumbago with sciatica, right side: Secondary | ICD-10-CM | POA: Diagnosis not present

## 2019-02-02 DIAGNOSIS — R262 Difficulty in walking, not elsewhere classified: Secondary | ICD-10-CM | POA: Diagnosis not present

## 2019-02-02 DIAGNOSIS — G8929 Other chronic pain: Secondary | ICD-10-CM | POA: Diagnosis not present

## 2019-02-02 DIAGNOSIS — M5442 Lumbago with sciatica, left side: Secondary | ICD-10-CM | POA: Insufficient documentation

## 2019-02-02 DIAGNOSIS — M6281 Muscle weakness (generalized): Secondary | ICD-10-CM | POA: Diagnosis not present

## 2019-02-02 DIAGNOSIS — R29898 Other symptoms and signs involving the musculoskeletal system: Secondary | ICD-10-CM | POA: Insufficient documentation

## 2019-02-02 DIAGNOSIS — R2681 Unsteadiness on feet: Secondary | ICD-10-CM

## 2019-02-02 NOTE — Patient Instructions (Addendum)
Isometric Abdominal   You may do this sitting  Lying on back with knees bent, tighten stomach by pressing elbows down. Hold ___5_ seconds. Repeat _5-10___ times per set. Do __1__ sets per session. Do _1___ sessions per day.  http://orth.exer.us/1086   Copyright  VHI. All rights reserved.  Strengthening: Hip Adduction - Isometric   May do sitting  With ball or folded pillow between knees, squeeze knees together. Hold __5__ seconds. Repeat ___5-10_ times per set. Do 1____ sets per session. Do __2__ sessions per day.  http://orth.exer.us/612   Copyright  VHI. All rights reserved.  Isometric Gluteals   May do sitting  Tighten buttock muscles. Repeat _10___ times per set. Do ___1_ sets per session. Do __2__ sessions per day.  http://orth.exer.us/1126   Copyright  VHI. All rights reserved.  Sit as tall as you can pushing your heels into the floor.  Hold 5 seconds and relax repeat 5-10 x.    Lift both arms up towards the ceiling hold 3 seconds and relax.

## 2019-02-02 NOTE — Therapy (Signed)
[place], [place], [address] Phone: [phone]   Fax:  [phone]  Physical Therapy Treatment  Patient Details  Name: Teresa Anderson MRN: [id number] Date of Birth: 01-04-1941 Referring Provider (PT): Suella Broad    Encounter Date: 02/02/2019  PT End of Session - 02/02/19 1201    Visit Number  2    Number of Visits  7    Date for PT Re-Evaluation  02/17/19    Authorization Type  [employer]HC Medicare    Authorization Time Period  01/27/19 to 02/17/19    Authorization - Visit Number  2    Authorization - Number of Visits  10    PT Start Time  8676    PT Stop Time  1201    PT Time Calculation (min)  38 min    Activity Tolerance  Patient limited by pain    Behavior During Therapy  Restless;Anxious       Past Medical History:  Diagnosis Date  . Allergy   . Arthritis   . Asthma   . Blood transfusion without reported diagnosis    with cancer surgery  . Cancer ([place])    breast and skin  . Diabetes mellitus without complication ([place])   . Hyperlipidemia   . Hypertension   . Lymphedema of leg   . Thyroid disease   . Varicose veins of bilateral lower extremities with other complications     Past Surgical History:  Procedure Laterality Date  . ABDOMINAL HYSTERECTOMY    . BREAST SURGERY     bilat  . CESAREAN SECTION     x4  . CHOLECYSTECTOMY    . COSMETIC SURGERY     reconstruction  . MASTECTOMY     bilateral    There were no vitals filed for this visit.  Subjective Assessment - 02/02/19 1130    Subjective  Teresa Anderson states that she  did the breathing exercises;  she is not sure it helped but she did them.   Her legs are killing her they just hurt deep and all over.    How long can you sit comfortably?  unsure but cannot drive for 2 hours    How long can you stand comfortably?  immediate pain    How long can you walk comfortably?  not long (did not give a distance)    Patient Stated Goals  ease pain     Currently in Pain?  Yes    Pain Score  6     Pain Location  Leg    Pain Orientation  Left;Right    Pain Descriptors / Indicators  Aching;Throbbing    Pain Type  Chronic pain    Pain Onset  More than a month ago    Aggravating Factors   not sure    Pain Relieving Factors  not sure           [facility] Adult PT Treatment/Exercise - 02/02/19 0001      Ambulation/Gait   Ambulation/Gait  Yes    Ambulation Distance (Feet)  226 Feet    Assistive device  None    Gait Pattern  Step-through pattern;Decreased arm swing - right;Decreased arm swing - left;Decreased dorsiflexion - right;Decreased dorsiflexion - left;Trendelenburg;Antalgic;Decreased trunk rotation;Trunk flexed      Exercises   Exercises  Lumbar      Lumbar Exercises: Standing   Heel Raises  10 reps    Other Standing Lumbar Exercises  wall arch with assist  from therapist for left arm       Lumbar Exercises: Seated   Other Seated Lumbar Exercises  isometric ab x 10; hip adduction x 10 sit tall x 10     Other Seated Lumbar Exercises  abdominal breathing x 10 ,glut set x 5; arms up overhead x 5                PT Short Term Goals - 02/02/19 1132      PT SHORT TERM GOAL #1   Title  Patient to be compliant with progressive meditation routine in order to assist in reducing anxiety and psychosocial impact of lumbar pain    Time  3    Period  Weeks    Status  On-going    Target Date  02/17/19      PT SHORT TERM GOAL #2   Title  Patient to be compliant with sleep hygiene regimen in order to improve sleep and promote physiological/biochemical components of sleep that work to reduce pain and inflammation    Time  3    Period  Weeks    Status  On-going      PT SHORT TERM GOAL #3   Title  Patient to report she has been able to sleep at least 3 hours without waking due to pain in order to show improved quality of life    Time  3    Period  Weeks    Status  On-going      PT SHORT TERM GOAL #4   Title  Patient to  demonstrate only moderate restrictions in lumbar mobility and consistent ability to perform sustained TA set during exercise in order to show improving tolerance to gross activity    Time  3    Period  Weeks    Status  On-going        PT Long Term Goals - 01/27/19 1437      PT LONG TERM GOAL #1   Title  No LTGs appropriate at this time- need to assess compliance with therapy and willingness to continue with therapy prior to extending visits/cert            Plan - 02/02/19 1205    Clinical Impression Statement  Therapist reviewed goals with patient.  Therapist completed sitting exercises with HMP on low back, however, pt states that this was making her to hot despite therapist adding padding.  Therapist stressed to pt that she was in contol and should not go beyond pain threshold.  Pt able to complete all exercises but neede assist with wall arch with her Lt arm.    Personal Factors and Comorbidities  Age;Behavior Pattern;Fitness;Past/Current Experience;Comorbidity 3+;Sex;Social Background;Education;Finances;Time since onset of injury/illness/exacerbation    Comorbidities  obesity, chronic pain syndrome, DM, Sjogren's syndrome    Examination-Activity Limitations  Bathing;Locomotion Level;Transfers;Bed Mobility;Reach Overhead;Bend;Self Feeding;Caring for Others;Sit;Carry;Sleep;Continence;Squat;Dressing;Stairs;Hygiene/Grooming;Stand;Toileting;Lift    Examination-Participation Restrictions  Church;Yard Work;Cleaning;Meal Prep;Community Activity;Personal Finances;Driving;School;Interpersonal Relationship;Shop;Laundry;Volunteer    Stability/Clinical Decision Making  Evolving/Moderate complexity    Rehab Potential  Fair    PT Frequency  2x / week    PT Duration  3 weeks    PT Treatment/Interventions  ADLs/Self Care Home Management;Biofeedback;Cryotherapy;Electrical Stimulation;Iontophoresis 4mg /ml Dexamethasone;Moist Heat;Traction;Ultrasound;Gait training;Stair training;Functional mobility  training;Therapeutic activities;Therapeutic exercise;Balance training;Neuromuscular re-education;Patient/family education;Manual techniques;Dry needling;Energy conservation;Taping;Visual/perceptual remediation/compensation;Joint Manipulations;Spinal Manipulations    PT Next Visit Plan  Review  TA marching, review meditation. Continue to work with pain neuroscience education and promote meditation.. Gentle exercises in sitting with cold pack or  TENS.  She is not a candidate for tough love, really needs pain science ed and exercise/mobility only as tolerated.    PT Home Exercise Plan  Eval: meditation plan, TA sets seated, TA marching seated; 8/6:  sitting as tall as possible, glut set, hip adduction set,    Consulted and Agree with Plan of Care  Patient       Patient will benefit from skilled therapeutic intervention in order to improve the following deficits and impairments:  Abnormal gait, Decreased coordination, Decreased range of motion, Difficulty walking, Increased fascial restricitons, Decreased safety awareness, Decreased endurance, Decreased activity tolerance, Impaired perceived functional ability, Obesity, Pain, Decreased balance, Hypomobility, Impaired flexibility, Improper body mechanics, Decreased mobility, Decreased strength, Postural dysfunction  Visit Diagnosis: 1. Chronic bilateral low back pain with bilateral sciatica   2. Muscle weakness (generalized)   3. Difficulty in walking, not elsewhere classified   4. Unsteadiness on feet        Problem List Patient Active Problem List   Diagnosis Date Noted  . Sjogren's syndrome with keratoconjunctivitis sicca ([place]) 04/22/2018  . Primary osteoarthritis of both hands 04/22/2018  . Primary osteoarthritis of both feet 04/22/2018  . DDD (degenerative disc disease), lumbar 04/22/2018  . Primary osteoarthritis of left shoulder 04/22/2018  . Abdominal hernia without obstruction or gangrene 10/12/2016  . Acute pain of left shoulder  10/08/2016  . Varicose veins of bilateral lower extremities with other complications 09/64/3838  . Essential hypertension 03/16/2016  . Lymphedema 03/16/2016  . Breast cancer in female [facility]) 03/16/2016  . Hypothyroidism 03/16/2016  . 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 Prediabetes 03/16/2016  . Obesity 03/16/2016  . Hyperlipidemia 03/16/2016  . Chronic pain syndrome 03/16/2016   Rayetta Humphrey, PT CLT [phone] 02/02/2019, 12:10 PM  [place] [address] [place], [place], [address] Phone: [phone]   Fax:  [phone]  Name: Teresa Anderson MRN: [id number] Date of Birth: 1941/05/23

## 2019-02-06 ENCOUNTER — Ambulatory Visit (HOSPITAL_COMMUNITY): Payer: Medicare Other | Admitting: Physical Therapy

## 2019-02-06 ENCOUNTER — Telehealth (HOSPITAL_COMMUNITY): Payer: Self-pay | Admitting: Internal Medicine

## 2019-02-06 DIAGNOSIS — H00011 Hordeolum externum right upper eyelid: Secondary | ICD-10-CM | POA: Diagnosis not present

## 2019-02-06 NOTE — Telephone Encounter (Signed)
02/06/19  pt called to cx said that she had a drs appt.

## 2019-02-08 ENCOUNTER — Other Ambulatory Visit: Payer: Self-pay

## 2019-02-08 ENCOUNTER — Ambulatory Visit (HOSPITAL_COMMUNITY): Payer: Medicare Other | Admitting: Physical Therapy

## 2019-02-08 ENCOUNTER — Encounter (HOSPITAL_COMMUNITY): Payer: Self-pay | Admitting: Physical Therapy

## 2019-02-08 DIAGNOSIS — R2681 Unsteadiness on feet: Secondary | ICD-10-CM

## 2019-02-08 DIAGNOSIS — G8929 Other chronic pain: Secondary | ICD-10-CM

## 2019-02-08 DIAGNOSIS — M6281 Muscle weakness (generalized): Secondary | ICD-10-CM | POA: Diagnosis not present

## 2019-02-08 DIAGNOSIS — R29898 Other symptoms and signs involving the musculoskeletal system: Secondary | ICD-10-CM | POA: Diagnosis not present

## 2019-02-08 DIAGNOSIS — M5442 Lumbago with sciatica, left side: Secondary | ICD-10-CM | POA: Diagnosis not present

## 2019-02-08 DIAGNOSIS — R262 Difficulty in walking, not elsewhere classified: Secondary | ICD-10-CM | POA: Diagnosis not present

## 2019-02-08 DIAGNOSIS — M5441 Lumbago with sciatica, right side: Secondary | ICD-10-CM | POA: Diagnosis not present

## 2019-02-08 NOTE — Therapy (Addendum)
[place], [place], [address] Phone: [phone]   Fax:  [phone]  Physical Therapy Treatment  Patient Details  Name: Teresa Anderson MRN: [id number] Date of Birth: 1941/05/14 Referring Provider (PT): Suella Broad    Encounter Date: 02/08/2019  PT End of Session - 02/08/19 1155    Visit Number  3    Number of Visits  7    Date for PT Re-Evaluation  02/17/19    Authorization Type  [employer] Medicare    Authorization Time Period  01/27/19 to 02/17/19    Authorization - Visit Number  3    Authorization - Number of Visits  10    PT Start Time  5427    PT Stop Time  1155    PT Time Calculation (min)  40 min    Activity Tolerance  Patient limited by pain    Behavior During Therapy  Restless;Anxious       Past Medical History:  Diagnosis Date  . Allergy   . Arthritis   . Asthma   . Blood transfusion without reported diagnosis    with cancer surgery  . Cancer ([place])    breast and skin  . Diabetes mellitus without complication ([place])   . Hyperlipidemia   . Hypertension   . Lymphedema of leg   . Thyroid disease   . Varicose veins of bilateral lower extremities with other complications     Past Surgical History:  Procedure Laterality Date  . ABDOMINAL HYSTERECTOMY    . BREAST SURGERY     bilat  . CESAREAN SECTION     x4  . CHOLECYSTECTOMY    . COSMETIC SURGERY     reconstruction  . MASTECTOMY     bilateral    There were no vitals filed for this visit.  Subjective Assessment - 02/08/19 1116    Subjective  PT states that her pain is better about a 4/10.  States that the the exercise where she raises her arms increased her pain and she does not want to do it.  Pt states that she is unable to lie flat.    How long can you sit comfortably?  unsure but cannot drive for 2 hours    How long can you stand comfortably?  immediate pain    How long can you walk comfortably?  not long (did not give a distance)    Patient Stated Goals  ease pain    Currently in Pain?  Yes    Pain Score  4     Pain Location  Back    Pain Orientation  Lower    Pain Descriptors / Indicators  Aching    Pain Type  Chronic pain    Pain Onset  More than a month ago    Pain Frequency  Constant    Aggravating Factors   activity    Pain Relieving Factors  not sure                       [facility] Adult PT Treatment/Exercise - 02/08/19 0001      Ambulation/Gait   Ambulation/Gait  --    Ambulation Distance (Feet)  --    Assistive device  --    Gait Pattern  --      Exercises   Exercises  Lumbar      Lumbar Exercises: Standing   Heel Raises  10 reps    Other Standing Lumbar Exercises  --  Lumbar Exercises: Seated   Other Seated Lumbar Exercises  modified thoracic excursionsx 5; clams   5 Each     Other Seated Lumbar Exercises  tall sitting, hip adduction isometric x 10 each       Lumbar Exercises: Supine   Ab Set  10 reps    Glut Set  10 reps    Other Supine Lumbar Exercises  decompression ex  1-5       Manual Therapy   Manual Therapy  Soft tissue mobilization    Manual therapy comments  done seperate from all other treatment     Soft tissue mobilization  done sitting to B lumbar/thoracic paraspinal mm        Assessment:   Therapist did not complete wall arch due to request of pt. Attempted decompression exercises which had to be done modified using wedge due to pt having issues breathing. Began manual while sitting with noted tight thoracic paraspinal mm.  PT will benefit from skilled PT for the following:    Abnormal gait; Decreased coordination; Decreased range of motion; Difficulty walking; Increased fascial restricitons; Decreased safety awareness; Decreased endurance; Decreased activity tolerance; Impaired perceived functional ability; Obesity; Pain; Decreased balance; Hypomobility; Impaired flexibility; Improper body mechanics; Decreased mobility; Decreased strength; Postural  dysfunction    PT Short Term Goals - 02/02/19 1132      PT SHORT TERM GOAL #1   Title  Patient to be compliant with progressive meditation routine in order to assist in reducing anxiety and psychosocial impact of lumbar pain    Time  3    Period  Weeks    Status  On-going    Target Date  02/17/19      PT SHORT TERM GOAL #2   Title  Patient to be compliant with sleep hygiene regimen in order to improve sleep and promote physiological/biochemical components of sleep that work to reduce pain and inflammation    Time  3    Period  Weeks    Status  On-going      PT SHORT TERM GOAL #3   Title  Patient to report she has been able to sleep at least 3 hours without waking due to pain in order to show improved quality of life    Time  3    Period  Weeks    Status  On-going      PT SHORT TERM GOAL #4   Title  Patient to demonstrate only moderate restrictions in lumbar mobility and consistent ability to perform sustained TA set during exercise in order to show improving tolerance to gross activity    Time  3    Period  Weeks    Status  On-going        PT Long Term Goals - 01/27/19 1437      PT LONG TERM GOAL #1   Title  No LTGs appropriate at this time- need to assess compliance with therapy and willingness to continue with therapy prior to extending visits/cert            Plan - 02/08/19 1204    Personal Factors and Comorbidities  Age;Behavior Pattern;Fitness;Past/Current Experience;Comorbidity 3+;Sex;Social Background;Education;Finances;Time since onset of injury/illness/exacerbation    Comorbidities  obesity, chronic pain syndrome, DM, Sjogren's syndrome    Examination-Activity Limitations  Bathing;Locomotion Level;Transfers;Bed Mobility;Reach Overhead;Bend;Self Feeding;Caring for Others;Sit;Carry;Sleep;Continence;Squat;Dressing;Stairs;Hygiene/Grooming;Stand;Toileting;Lift    Examination-Participation Restrictions  Church;Yard Work;Cleaning;Meal Prep;Community  Activity;Personal Finances;Driving;School;Interpersonal Relationship;Shop;Laundry;Volunteer    Stability/Clinical Decision Making  Evolving/Moderate complexity    Rehab Potential  Fair    PT Frequency  2x / week    PT Duration  3 weeks    PT Treatment/Interventions  ADLs/Self Care Home Management;Biofeedback;Cryotherapy;Electrical Stimulation;Iontophoresis 4mg /ml Dexamethasone;Moist Heat;Traction;Ultrasound;Gait training;Stair training;Functional mobility training;Therapeutic activities;Therapeutic exercise;Balance training;Neuromuscular re-education;Patient/family education;Manual techniques;Dry needling;Energy conservation;Taping;Visual/perceptual remediation/compensation;Joint Manipulations;Spinal Manipulations    PT Next Visit Plan  Review  TA marching, review meditation. Continue to work with pain neuroscience education and promote meditation.. Gentle exercises in sitting with cold pack or  TENS. She is not a candidate for tough love, really needs pain science ed and exercise/mobility only as tolerated.    PT Home Exercise Plan  Eval: meditation plan, TA sets seated, TA marching seated; 8/6:  sitting as tall as possible, glut set, hip adduction set,    Consulted and Agree with Plan of Care  Patient       Patient will benefit from skilled therapeutic intervention in order to improve the following deficits and impairments:  Abnormal gait, Decreased coordination, Decreased range of motion, Difficulty walking, Increased fascial restricitons, Decreased safety awareness, Decreased endurance, Decreased activity tolerance, Impaired perceived functional ability, Obesity, Pain, Decreased balance, Hypomobility, Impaired flexibility, Improper body mechanics, Decreased mobility, Decreased strength, Postural dysfunction  Visit Diagnosis: 1. Chronic bilateral low back pain with bilateral sciatica   2. Muscle weakness (generalized)   3. Difficulty in walking, not elsewhere classified   4. Unsteadiness on feet         Problem List Patient Active Problem List   Diagnosis Date Noted  . Sjogren's syndrome with keratoconjunctivitis sicca ([place]) 04/22/2018  . Primary osteoarthritis of both hands 04/22/2018  . Primary osteoarthritis of both feet 04/22/2018  . DDD (degenerative disc disease), lumbar 04/22/2018  . Primary osteoarthritis of left shoulder 04/22/2018  . Abdominal hernia without obstruction or gangrene 10/12/2016  . Acute pain of left shoulder 10/08/2016  . Varicose veins of bilateral lower extremities with other complications 64/68/0321  . Essential hypertension 03/16/2016  . Lymphedema 03/16/2016  . Breast cancer in female [facility]) 03/16/2016  . Hypothyroidism 03/16/2016  . 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 Prediabetes 03/16/2016  . Obesity 03/16/2016  . Hyperlipidemia 03/16/2016  . Chronic pain syndrome 03/16/2016    Rayetta Humphrey, PT CLT [phone] 02/08/2019, 12:05 PM  [facility] [address] [place], [place], [address] Phone: [phone]   Fax:  [phone]  Name: Teresa Anderson MRN: [id number] Date of Birth: 1940/10/23

## 2019-02-13 ENCOUNTER — Other Ambulatory Visit: Payer: Self-pay

## 2019-02-13 ENCOUNTER — Ambulatory Visit (HOSPITAL_COMMUNITY): Payer: Medicare Other | Admitting: Physical Therapy

## 2019-02-13 DIAGNOSIS — R262 Difficulty in walking, not elsewhere classified: Secondary | ICD-10-CM

## 2019-02-13 DIAGNOSIS — M6281 Muscle weakness (generalized): Secondary | ICD-10-CM | POA: Diagnosis not present

## 2019-02-13 DIAGNOSIS — G8929 Other chronic pain: Secondary | ICD-10-CM | POA: Diagnosis not present

## 2019-02-13 DIAGNOSIS — R2681 Unsteadiness on feet: Secondary | ICD-10-CM | POA: Diagnosis not present

## 2019-02-13 DIAGNOSIS — M5442 Lumbago with sciatica, left side: Secondary | ICD-10-CM | POA: Diagnosis not present

## 2019-02-13 DIAGNOSIS — M5441 Lumbago with sciatica, right side: Secondary | ICD-10-CM | POA: Diagnosis not present

## 2019-02-13 DIAGNOSIS — R29898 Other symptoms and signs involving the musculoskeletal system: Secondary | ICD-10-CM | POA: Diagnosis not present

## 2019-02-13 NOTE — Therapy (Signed)
[place], [place], [address] Phone: [phone]   Fax:  [phone]  Physical Therapy Treatment  Patient Details  Name: Teresa Anderson MRN: [id number] Date of Birth: 04/26/1941 Referring Provider (PT): Suella Broad    Encounter Date: 02/13/2019  PT End of Session - 02/13/19 1623    Visit Number  4    Number of Visits  7    Date for PT Re-Evaluation  02/17/19    Authorization Type  [employer] Medicare    Authorization Time Period  01/27/19 to 02/17/19    Authorization - Visit Number  4    Authorization - Number of Visits  10    PT Start Time  1510    PT Stop Time  1552    PT Time Calculation (min)  42 min    Activity Tolerance  Patient limited by pain    Behavior During Therapy  Restless;Anxious       Past Medical History:  Diagnosis Date  . Allergy   . Arthritis   . Asthma   . Blood transfusion without reported diagnosis    with cancer surgery  . Cancer ([place])    breast and skin  . Diabetes mellitus without complication ([place])   . Hyperlipidemia   . Hypertension   . Lymphedema of leg   . Thyroid disease   . Varicose veins of bilateral lower extremities with other complications     Past Surgical History:  Procedure Laterality Date  . ABDOMINAL HYSTERECTOMY    . BREAST SURGERY     bilat  . CESAREAN SECTION     x4  . CHOLECYSTECTOMY    . COSMETIC SURGERY     reconstruction  . MASTECTOMY     bilateral    There were no vitals filed for this visit.  Subjective Assessment - 02/13/19 1512    Subjective  pt late today due to pouring rain.  states her back continues to hurt 6/10 and states she forgot to take her pain medicine. states her legs hurt but unable to specify what parts of her legs.   Reports all the exercises bother her back but feels she is getting better.    Currently in Pain?  Yes    Pain Score  6     Pain Location  Back    Pain Orientation  Lower    Pain Descriptors / Indicators   Aching    Pain Type  Chronic pain                       [facility] Adult PT Treatment/Exercise - 02/13/19 0001      Lumbar Exercises: Seated   Long Arc Quad on Chair  10 reps;Limitations   on mat with lumbar stabilization   Hip Flexion on Ball  10 reps   no ball, on mat with stabilization   Sit to Stand  10 reps;Limitations    Sit to Stand Limitations  no UE's    Other Seated Lumbar Exercises  thoracic excursions with arms crossed 5 reps each    Other Seated Lumbar Exercises  abdominal and gluteal sets 10 reps each      Lumbar Exercises: Supine   Other Supine Lumbar Exercises  --   unable to do secondary to inability to lay supine.     Manual Therapy   Manual Therapy  Soft tissue mobilization    Manual therapy comments  done seperate from all other treatment  Soft tissue mobilization  done in Lt sidelying to bilateral lumbar/thoracic paraspinals             PT Education - 02/13/19 1628    Education Details  noted lymphedema in bilatereal LE's.  Educated on how this could be affecting her LE pain and shown never products available for compression.  Pt urged to discuss with MD if interested in treatment.    Person(s) Educated  Patient    Methods  Explanation    Comprehension  Verbalized understanding       PT Short Term Goals - 02/02/19 1132      PT SHORT TERM GOAL #1   Title  Patient to be compliant with progressive meditation routine in order to assist in reducing anxiety and psychosocial impact of lumbar pain    Time  3    Period  Weeks    Status  On-going    Target Date  02/17/19      PT SHORT TERM GOAL #2   Title  Patient to be compliant with sleep hygiene regimen in order to improve sleep and promote physiological/biochemical components of sleep that work to reduce pain and inflammation    Time  3    Period  Weeks    Status  On-going      PT SHORT TERM GOAL #3   Title  Patient to report she has been able to sleep at least 3 hours without  waking due to pain in order to show improved quality of life    Time  3    Period  Weeks    Status  On-going      PT SHORT TERM GOAL #4   Title  Patient to demonstrate only moderate restrictions in lumbar mobility and consistent ability to perform sustained TA set during exercise in order to show improving tolerance to gross activity    Time  3    Period  Weeks    Status  On-going        PT Long Term Goals - 01/27/19 1437      PT LONG TERM GOAL #1   Title  No LTGs appropriate at this time- need to assess compliance with therapy and willingness to continue with therapy prior to extending visits/cert            Plan - 02/13/19 1624    Clinical Impression Statement  completed all therex in seated position this session.  Worked on thoracic mobility without UE movements and lumbar stabilization with LE therex.  PT able to complete with cues for form, reducing speed of therex and maintaining stability.  pt without complaints completing these exercises. Attempted supine, however position too painful to remain in to complete exercises.  Manual to lumbar and thoracic paraspinals completed in Lt sidelying with overall comfort.  Tightness palpated, however no spasms.    Personal Factors and Comorbidities  Age;Behavior Pattern;Fitness;Past/Current Experience;Comorbidity 3+;Sex;Social Background;Education;Finances;Time since onset of injury/illness/exacerbation    Comorbidities  obesity, chronic pain syndrome, DM, Sjogren's syndrome    Examination-Activity Limitations  Bathing;Locomotion Level;Transfers;Bed Mobility;Reach Overhead;Bend;Self Feeding;Caring for Others;Sit;Carry;Sleep;Continence;Squat;Dressing;Stairs;Hygiene/Grooming;Stand;Toileting;Lift    Examination-Participation Restrictions  Church;Yard Work;Cleaning;Meal Prep;Community Activity;Personal Finances;Driving;School;Interpersonal Relationship;Shop;Laundry;Volunteer    Stability/Clinical Decision Making  Evolving/Moderate complexity     Rehab Potential  Fair    PT Frequency  2x / week    PT Duration  3 weeks    PT Treatment/Interventions  ADLs/Self Care Home Management;Biofeedback;Cryotherapy;Electrical Stimulation;Iontophoresis 4mg /ml Dexamethasone;Moist Heat;Traction;Ultrasound;Gait training;Stair training;Functional mobility training;Therapeutic activities;Therapeutic exercise;Balance training;Neuromuscular re-education;Patient/family education;Manual techniques;Dry  needling;Energy conservation;Taping;Visual/perceptual remediation/compensation;Joint Manipulations;Spinal Manipulations    PT Next Visit Plan  Review  TA marching, review meditation. Continue to work with pain neuroscience education and promote meditation.. Gentle exercises in sitting with cold pack or  TENS. She is not a candidate for tough love, really needs pain science ed and exercise/mobility only as tolerated.    PT Home Exercise Plan  Eval: meditation plan, TA sets seated, TA marching seated; 8/6:  sitting as tall as possible, glut set, hip adduction set,    Consulted and Agree with Plan of Care  Patient       Patient will benefit from skilled therapeutic intervention in order to improve the following deficits and impairments:  Abnormal gait, Decreased coordination, Decreased range of motion, Difficulty walking, Increased fascial restricitons, Decreased safety awareness, Decreased endurance, Decreased activity tolerance, Impaired perceived functional ability, Obesity, Pain, Decreased balance, Hypomobility, Impaired flexibility, Improper body mechanics, Decreased mobility, Decreased strength, Postural dysfunction  Visit Diagnosis: 1. Muscle weakness (generalized)   2. Difficulty in walking, not elsewhere classified   3. Chronic bilateral low back pain with bilateral sciatica        Problem List Patient Active Problem List   Diagnosis Date Noted  . Sjogren's syndrome with keratoconjunctivitis sicca ([place]) 04/22/2018  . Primary osteoarthritis of both  hands 04/22/2018  . Primary osteoarthritis of both feet 04/22/2018  . DDD (degenerative disc disease), lumbar 04/22/2018  . Primary osteoarthritis of left shoulder 04/22/2018  . Abdominal hernia without obstruction or gangrene 10/12/2016  . Acute pain of left shoulder 10/08/2016  . Varicose veins of bilateral lower extremities with other complications 44/08/4740  . Essential hypertension 03/16/2016  . Lymphedema 03/16/2016  . Breast cancer in female [facility]) 03/16/2016  . Hypothyroidism 03/16/2016  . 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 Prediabetes 03/16/2016  . Obesity 03/16/2016  . Hyperlipidemia 03/16/2016  . Chronic pain syndrome 03/16/2016   Teena Irani, PTA/CLT [phone]  Teena Irani 02/13/2019, 4:29 PM  [place] [address] [place], [place], [address] Phone: [phone]   Fax:  [phone]  Name: Teresa Anderson MRN: [id number] Date of Birth: 02/01/1941

## 2019-02-15 ENCOUNTER — Encounter (HOSPITAL_COMMUNITY): Payer: Self-pay

## 2019-02-15 ENCOUNTER — Ambulatory Visit (HOSPITAL_COMMUNITY): Payer: Medicare Other

## 2019-02-15 ENCOUNTER — Other Ambulatory Visit: Payer: Self-pay

## 2019-02-15 DIAGNOSIS — R29898 Other symptoms and signs involving the musculoskeletal system: Secondary | ICD-10-CM | POA: Diagnosis not present

## 2019-02-15 DIAGNOSIS — R2681 Unsteadiness on feet: Secondary | ICD-10-CM | POA: Diagnosis not present

## 2019-02-15 DIAGNOSIS — R262 Difficulty in walking, not elsewhere classified: Secondary | ICD-10-CM

## 2019-02-15 DIAGNOSIS — M5441 Lumbago with sciatica, right side: Secondary | ICD-10-CM | POA: Diagnosis not present

## 2019-02-15 DIAGNOSIS — M6281 Muscle weakness (generalized): Secondary | ICD-10-CM

## 2019-02-15 DIAGNOSIS — G8929 Other chronic pain: Secondary | ICD-10-CM | POA: Diagnosis not present

## 2019-02-15 DIAGNOSIS — M5442 Lumbago with sciatica, left side: Secondary | ICD-10-CM | POA: Diagnosis not present

## 2019-02-15 NOTE — Therapy (Addendum)
[place], [place], [address] Phone: [phone]   Fax:  [phone]  Physical Therapy Treatment  Patient Details  Name: Teresa Anderson MRN: [id number] Date of Birth: 1941-03-17 Referring Provider (PT): Suella Broad    Encounter Date: 02/15/2019  PT End of Session - 02/15/19 1706    Visit Number  5    Number of Visits  7    Date for PT Re-Evaluation  02/17/19    Authorization Type  [employer] Medicare    Authorization Time Period  01/27/19 to 02/17/19    Authorization - Visit Number  5    Authorization - Number of Visits  10    PT Start Time  1628   pt late for apt   PT Stop Time  1703    PT Time Calculation (min)  35 min    Activity Tolerance  Patient limited by pain;No increased pain;Patient tolerated treatment well    Behavior During Therapy  [facility] for tasks assessed/performed       Past Medical History:  Diagnosis Date  . Allergy   . Arthritis   . Asthma   . Blood transfusion without reported diagnosis    with cancer surgery  . Cancer ([place])    breast and skin  . Diabetes mellitus without complication ([place])   . Hyperlipidemia   . Hypertension   . Lymphedema of leg   . Thyroid disease   . Varicose veins of bilateral lower extremities with other complications     Past Surgical History:  Procedure Laterality Date  . ABDOMINAL HYSTERECTOMY    . BREAST SURGERY     bilat  . CESAREAN SECTION     x4  . CHOLECYSTECTOMY    . COSMETIC SURGERY     reconstruction  . MASTECTOMY     bilateral    There were no vitals filed for this visit.  Subjective Assessment - 02/15/19 1628    Subjective  Pt reports she woke up last night due to BLE cramping in thigh.  Reports she is making some improvements, able to bend over with less pain.  CUrrent pain scale 4/10, deep achey "bone marrow if achey".    How long can you sit comfortably?  unsure but cannot drive for 2 hours    How long can you stand comfortably?   immediate pain,    How long can you walk comfortably?  not long (did not give a distance)    Currently in Pain?  Yes    Pain Score  4     Pain Location  Back    Pain Orientation  Lower    Pain Descriptors / Indicators  Aching    Pain Type  Chronic pain         [facility] PT Assessment - 02/15/19 0001      Assessment   Medical Diagnosis  back pain     Referring Provider (PT)  Suella Broad     Onset Date/Surgical Date  --   chronic   Next MD Visit  unscheduled     Prior Therapy  3-4 years ago       Observation/Other Assessments   Focus on Therapeutic Outcomes (FOTO)   59% limited    was 73% limited     Posture/Postural Control   Posture/Postural Control  Postural limitations    Postural Limitations  Rounded Shoulders;Forward head;Flexed trunk      AROM   Lumbar Flexion  Able to touch toes  with fingers, pain   unable to isolate lumbar flexion from knee pain   Lumbar Extension  40% limited pain   was severe   Lumbar - Right Side Bend  moderate limitation    moderate   Lumbar - Left Side Bend  moderate limitation   moderate   Thoracic Extension  severe limitation       Strength   Right Hip Flexion  3+/5   was 3 (testing done in seated)   Right Hip Extension  3-/5   (testing done in seated)   Right Hip ABduction  3+/5   seated position   Left Hip Flexion  4-/5   was 3 (testing done in seated)   Left Hip Extension  3-/5   (testing done in seated)   Right Knee Extension  4/5   was 3/5   Left Knee Extension  4-/5   was 3/5   Right Ankle Dorsiflexion  5/5    Left Ankle Dorsiflexion  5/5      Ambulation/Gait   Ambulation/Gait  Yes    Ambulation Distance (Feet)  364 Feet   during 2MWT   Assistive device  None    Gait Pattern  Step-through pattern;Decreased arm swing - right;Decreased arm swing - left;Decreased dorsiflexion - right;Decreased dorsiflexion - left;Trendelenburg;Antalgic;Decreased trunk rotation;Trunk flexed    Gait Comments  2MWT                    [facility] Adult PT Treatment/Exercise - 02/15/19 0001      Exercises   Exercises  Lumbar      Lumbar Exercises: Seated   Other Seated Lumbar Exercises  tall sitting with abdominal and glut sets       Manual Therapy   Manual Therapy  Soft tissue mobilization    Manual therapy comments  done seperate from all other treatment     Soft tissue mobilization  done in sitting to paraspinals, QL               PT Short Term Goals - 02/15/19 1631      PT SHORT TERM GOAL #1   Title  Patient to be compliant with progressive meditation routine in order to assist in reducing anxiety and psychosocial impact of lumbar pain    Baseline  8/19:  Reports she has began meditation routine, does not feel is helping.    Status  Not Met      PT SHORT TERM GOAL #2   Title  Patient to be compliant with sleep hygiene regimen in order to improve sleep and promote physiological/biochemical components of sleep that work to reduce pain and inflammation    Baseline  8/19:  Reports she only lays in bed to sleep at night or if in extreme pain    Status  On-going      PT SHORT TERM GOAL #3   Title  Patient to report she has been able to sleep at least 3 hours without waking due to pain in order to show improved quality of life    Baseline  02/15/19:  Reports she has been very tired, continues to stay up during the night due to pain.  Also has to wake up periodically to pee or drink water due to dry throat      PT SHORT TERM GOAL #4   Title  Patient to demonstrate only moderate restrictions in lumbar mobility and consistent ability to perform sustained TA set during exercise in order to show improving  tolerance to gross activity    Baseline  02/15/19:  Continued to have moderate restristions over lumbar paraspinals.  Cueing to activate TA sets    Status  On-going       LONG TERM GOALS (late addition 03/22/19)   Patient to demonstrate at least a 1 MMT grade improvement in order to show  improved functional muscle strength and improve functional task tolerance  Patient to demonstrate correct functional mechanics for bed mobility in order to avoid aggravating back pain  Patient to be compliant with progressive walking program to improve general fitness and promote long term mobility and pain reliefPatient to demonstrate at least a 1 MMT grade improvement in order to show improved functional muscle strength and improve functional task tolerance  Patient to demonstrate correct functional mechanics for bed mobility in order to avoid aggravating back pain  Patient to be compliant with progressive walking program to improve general fitness and promote long term mobility and pain relief  Late addendum by Teresa Anderson PT DPT CBIS 03/22/19   Plan - 02/15/19 1723    Clinical Impression Statement  Reviewed goals and objective testing including 2MWT and MMT.  Pt reports decrease in overall pain today, does continue to have difficulty with sleeping due to pain as well as need to urinate through night as well as get up and drink water for dry throat.  Stated the meditation doesn't help.  Pt improving strength for hip musculature (testing complete in seated position).  Continues to demonstrate weak abdominal musculature noted by amount of cueing required and inability to hold for prolonged period of time.  Pt with vast improvements with FOTO from 27.07 to 40.87% indication improved self perceived functional abilities.  EOS with manual soft tissue mobilization to address restrictions in thoracic and lumbar paraspinals, continues to have moderate tightness.  Following discussion pt wishes to contined PT for LBP, also interested in beginning lymphedema treatments.    Personal Factors and Comorbidities  Age;Behavior Pattern;Fitness;Past/Current Experience;Comorbidity 3+;Sex;Social Background;Education;Finances;Time since onset of injury/illness/exacerbation    Comorbidities  obesity, chronic pain  syndrome, DM, Sjogren's syndrome    Examination-Activity Limitations  Bathing;Locomotion Level;Transfers;Bed Mobility;Reach Overhead;Bend;Self Feeding;Caring for Others;Sit;Carry;Sleep;Continence;Squat;Dressing;Stairs;Hygiene/Grooming;Stand;Toileting;Lift    Examination-Participation Restrictions  Church;Yard Work;Cleaning;Meal Prep;Community Activity;Personal Finances;Driving;School;Interpersonal Relationship;Shop;Laundry;Volunteer    Stability/Clinical Decision Making  Evolving/Moderate complexity    Clinical Decision Making  Moderate    Rehab Potential  Fair    PT Frequency  2x / week    PT Duration  4 weeks    PT Treatment/Interventions  ADLs/Self Care Home Management;Biofeedback;Cryotherapy;Electrical Stimulation;Iontophoresis '4mg'$ /ml Dexamethasone;Moist Heat;Traction;Ultrasound;Gait training;Stair training;Functional mobility training;Therapeutic activities;Therapeutic exercise;Balance training;Neuromuscular re-education;Patient/family education;Manual techniques;Dry needling;Energy conservation;Taping;Visual/perceptual remediation/compensation;Joint Manipulations;Spinal Manipulations    PT Next Visit Plan  Continue with current POC.  Also referral for lymphedema treatments.  Review  TA marching, review meditation. Continue to work with pain neuroscience education and promote meditation.. Gentle exercises in sitting with cold pack or  TENS. She is not a candidate for tough love, really needs pain science ed and exercise/mobility only as tolerated.    PT Home Exercise Plan  Eval: meditation plan, TA sets seated, TA marching seated; 8/6:  sitting as tall as possible, glut set, hip adduction set,    Recommended Other Services  Lymphedema      Change in frequency to 2x/week for 4 weeks done by Teresa Anderson PT DPT [facility]IS late addendum 03/22/19  Patient will benefit from skilled therapeutic intervention in order to improve the following deficits and impairments:  Abnormal gait, Decreased coordination,  Decreased range of motion, Difficulty walking, Increased fascial restricitons, Decreased safety awareness, Decreased endurance, Decreased activity tolerance, Impaired perceived functional ability, Obesity, Pain, Decreased balance, Hypomobility, Impaired flexibility, Improper body mechanics, Decreased mobility, Decreased strength, Postural dysfunction  Visit Diagnosis: 1. Muscle weakness (generalized)   2. Difficulty in walking, not elsewhere classified   3. Chronic bilateral low back pain with bilateral sciatica   4. Unsteadiness on feet   5. Other symptoms and signs involving the musculoskeletal system        Problem List Patient Active Problem List   Diagnosis Date Noted  . Sjogren's syndrome with keratoconjunctivitis sicca ([place]) 04/22/2018  . Primary osteoarthritis of both hands 04/22/2018  . Primary osteoarthritis of both feet 04/22/2018  . DDD (degenerative disc disease), lumbar 04/22/2018  . Primary osteoarthritis of left shoulder 04/22/2018  . Abdominal hernia without obstruction or gangrene 10/12/2016  . Acute pain of left shoulder 10/08/2016  . Varicose veins of bilateral lower extremities with other complications 83/15/1761  . Essential hypertension 03/16/2016  . Lymphedema 03/16/2016  . Breast cancer in female [facility]) 03/16/2016  . Hypothyroidism 03/16/2016  . 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 Prediabetes 03/16/2016  . Obesity 03/16/2016  . Hyperlipidemia 03/16/2016  . Chronic pain syndrome 03/16/2016   Teresa Anderson, [place]; [place]  Aldona Lento 02/15/2019, 6:08 PM  [place] [address] [place], [place], [address] Phone: [phone]   Fax:  [phone]  Name: Teresa Anderson MRN: [id number] Date of Birth: 08/14/1940

## 2019-02-16 DIAGNOSIS — R600 Localized edema: Secondary | ICD-10-CM | POA: Diagnosis not present

## 2019-02-16 DIAGNOSIS — E039 Hypothyroidism, unspecified: Secondary | ICD-10-CM | POA: Diagnosis not present

## 2019-02-16 DIAGNOSIS — I10 Essential (primary) hypertension: Secondary | ICD-10-CM | POA: Diagnosis not present

## 2019-02-16 DIAGNOSIS — E782 Mixed hyperlipidemia: Secondary | ICD-10-CM | POA: Diagnosis not present

## 2019-02-16 NOTE — Addendum Note (Signed)
Addended by: Hunt Oris on: 02/16/2019 08:34 AM   Modules accepted: Orders

## 2019-02-17 DIAGNOSIS — M5136 Other intervertebral disc degeneration, lumbar region: Secondary | ICD-10-CM | POA: Diagnosis not present

## 2019-02-20 ENCOUNTER — Ambulatory Visit (HOSPITAL_COMMUNITY): Payer: Medicare Other | Admitting: Physical Therapy

## 2019-02-20 ENCOUNTER — Telehealth: Payer: Self-pay | Admitting: Nurse Practitioner

## 2019-02-20 ENCOUNTER — Other Ambulatory Visit: Payer: Self-pay | Admitting: Physical Medicine and Rehabilitation

## 2019-02-20 DIAGNOSIS — M48061 Spinal stenosis, lumbar region without neurogenic claudication: Secondary | ICD-10-CM

## 2019-02-20 NOTE — Telephone Encounter (Signed)
Phone call to patient to verify medication list and allergies for myelogram procedure. Pt aware she will not need to hold any medications for this procedure. Pre and post procedure instructions reviewed with pt. 

## 2019-02-22 ENCOUNTER — Ambulatory Visit (HOSPITAL_COMMUNITY): Payer: Medicare Other | Admitting: Physical Therapy

## 2019-02-22 DIAGNOSIS — I890 Lymphedema, not elsewhere classified: Secondary | ICD-10-CM | POA: Diagnosis not present

## 2019-02-22 DIAGNOSIS — R760 Raised antibody titer: Secondary | ICD-10-CM | POA: Diagnosis not present

## 2019-02-22 DIAGNOSIS — G894 Chronic pain syndrome: Secondary | ICD-10-CM | POA: Diagnosis not present

## 2019-02-22 DIAGNOSIS — M797 Fibromyalgia: Secondary | ICD-10-CM | POA: Diagnosis not present

## 2019-02-22 DIAGNOSIS — M25551 Pain in right hip: Secondary | ICD-10-CM | POA: Diagnosis not present

## 2019-02-24 ENCOUNTER — Telehealth (HOSPITAL_COMMUNITY): Payer: Self-pay | Admitting: Physical Therapy

## 2019-02-24 ENCOUNTER — Ambulatory Visit (HOSPITAL_COMMUNITY): Payer: Medicare Other | Admitting: Physical Therapy

## 2019-02-24 NOTE — Telephone Encounter (Signed)
Pt called she is in too much pain to come into today- r/s for Monday wiht Rachel.

## 2019-02-24 NOTE — Telephone Encounter (Signed)
Called and spoke with patient regarding her high pain levels. Ms. Stain confirms that she had been feeling better and she is not sure what she did to be having this much pain again- she guesses maybe the weather has an effect. Advised her to alternate between spending time in her most comfortable position and walking laps/doing exercises in her home at least 1x/hour to attempt to assist in reducing pain.   She confirms that right now if she feels able she will be at her appointment on Monday, and this PT strongly encouraged her to come even in the face of high pain levels so that therapy staff may better help her.   Deniece Ree PT, DPT, CBIS  Supplemental Physical Therapist [facility]    Pager [phone] Acute Rehab Office [phone]

## 2019-02-27 ENCOUNTER — Telehealth (HOSPITAL_COMMUNITY): Payer: Self-pay | Admitting: Internal Medicine

## 2019-02-27 ENCOUNTER — Ambulatory Visit (HOSPITAL_COMMUNITY): Payer: Medicare Other

## 2019-02-27 NOTE — Telephone Encounter (Signed)
02/27/19  Pt called at 1:24 to say she thought her appt today was at 230.  She missed the 115 appt and I rescheduled her for 9/1

## 2019-02-28 ENCOUNTER — Ambulatory Visit (HOSPITAL_COMMUNITY): Payer: Medicare Other | Attending: Physical Medicine and Rehabilitation | Admitting: Physical Therapy

## 2019-02-28 ENCOUNTER — Other Ambulatory Visit: Payer: Self-pay

## 2019-02-28 DIAGNOSIS — M5442 Lumbago with sciatica, left side: Secondary | ICD-10-CM | POA: Diagnosis not present

## 2019-02-28 DIAGNOSIS — M6281 Muscle weakness (generalized): Secondary | ICD-10-CM | POA: Insufficient documentation

## 2019-02-28 DIAGNOSIS — G8929 Other chronic pain: Secondary | ICD-10-CM | POA: Insufficient documentation

## 2019-02-28 DIAGNOSIS — M5441 Lumbago with sciatica, right side: Secondary | ICD-10-CM | POA: Insufficient documentation

## 2019-02-28 DIAGNOSIS — R262 Difficulty in walking, not elsewhere classified: Secondary | ICD-10-CM | POA: Diagnosis not present

## 2019-02-28 NOTE — Therapy (Signed)
[place], [place], [address] Phone: [phone]   Fax:  [phone]  Physical Therapy Treatment  Patient Details  Name: Teresa Anderson MRN: [id number] Date of Birth: [birth year] Referring Provider (PT): Suella Broad    Encounter Date: 02/28/2019  PT End of Session - 02/28/19 1434    Visit Number  6    Number of Visits  12    Date for PT Re-Evaluation  02/17/19    Authorization Type  [employer] Medicare    Authorization Time Period  2/50/03 to 12/31/86; cert 8/91-6/94/50    Authorization - Visit Number  1    Authorization - Number of Visits  6    PT Start Time  3888    PT Stop Time  1455    PT Time Calculation (min)  50 min    Activity Tolerance  Patient limited by pain;No increased pain;Patient tolerated treatment well    Behavior During Therapy  [facility] for tasks assessed/performed       Past Medical History:  Diagnosis Date  . Allergy   . Arthritis   . Asthma   . Blood transfusion without reported diagnosis    with cancer surgery  . Cancer ([place])    breast and skin  . Diabetes mellitus without complication ([place])   . Hyperlipidemia   . Hypertension   . Lymphedema of leg   . Thyroid disease   . Varicose veins of bilateral lower extremities with other complications     Past Surgical History:  Procedure Laterality Date  . ABDOMINAL HYSTERECTOMY    . BREAST SURGERY     bilat  . CESAREAN SECTION     x4  . CHOLECYSTECTOMY    . COSMETIC SURGERY     reconstruction  . MASTECTOMY     bilateral    There were no vitals filed for this visit.  Subjective Assessment - 02/28/19 1503    Subjective  pt states she has good and bad days.  Currently having a decent day with pain at 5/10.  States her MD wants her to have a myelogram but she is scared to do that. States she forgot to discuss lymphedema therapy with him.                       [facility] Adult PT Treatment/Exercise - 02/28/19 0001       Lumbar Exercises: Seated   Long Arc Quad on Chair  10 reps;Limitations    Hip Flexion on Ball  10 reps    Sit to Stand  10 reps;Limitations    Sit to Stand Limitations  no UE's    Other Seated Lumbar Exercises  thoracic excursions with arms crossed 5 reps each    Other Seated Lumbar Exercises  tall sitting with abdominal and glut sets       Manual Therapy   Manual Therapy  Soft tissue mobilization    Manual therapy comments  done seperate from all other treatment     Soft tissue mobilization  done in sitting to paraspinals, QL Rt>Lt               PT Short Term Goals - 02/15/19 1631      PT SHORT TERM GOAL #1   Title  Patient to be compliant with progressive meditation routine in order to assist in reducing anxiety and psychosocial impact of lumbar pain    Baseline  8/19:  Reports she has began meditation  routine, does not feel is helping.    Status  Not Met      PT SHORT TERM GOAL #2   Title  Patient to be compliant with sleep hygiene regimen in order to improve sleep and promote physiological/biochemical components of sleep that work to reduce pain and inflammation    Baseline  8/19:  Reports she only lays in bed to sleep at night or if in extreme pain    Status  On-going      PT SHORT TERM GOAL #3   Title  Patient to report she has been able to sleep at least 3 hours without waking due to pain in order to show improved quality of life    Baseline  02/15/19:  Reports she has been very tired, continues to stay up during the night due to pain.  Also has to wake up periodically to pee or drink water due to dry throat      PT SHORT TERM GOAL #4   Title  Patient to demonstrate only moderate restrictions in lumbar mobility and consistent ability to perform sustained TA set during exercise in order to show improving tolerance to gross activity    Baseline  02/15/19:  Continued to have moderate restristions over lumbar paraspinals.  Cueing to activate TA sets    Status  On-going         PT Long Term Goals - 02/16/19 6384      PT LONG TERM GOAL #1   Title  Patient to demonstrate at least a 1 MMT grade improvement in order to show improved functional muscle strength and improve functional task tolerance    Time  4    Period  Weeks    Status  New    Target Date  03/16/19      PT LONG TERM GOAL #2   Title  Patient to demonstrate correct functional mechanics for bed mobility in order to avoid aggravating back pain    Time  4    Period  Weeks    Status  New      PT LONG TERM GOAL #3   Title  Patient to be compliant with progressive walking program to improve general fitness and promote long term mobility and pain relief    Time  4    Period  Weeks    Status  New            Plan - 02/28/19 1504    Clinical Impression Statement  conintued with estblished therex to help improve LE and core strength/stability.  Attempted to add sidelying clams, completed 5 reps with Rt and when transitioning to Rt side to complete on Lt, c/o increased pain in to Rt hip and had to sit up.  Unable to resume exercises and then completed manual therapy in seated.  Pt reported overall improvement following manual today.    Personal Factors and Comorbidities  Age;Behavior Pattern;Fitness;Past/Current Experience;Comorbidity 3+;Sex;Social Background;Education;Finances;Time since onset of injury/illness/exacerbation    Comorbidities  obesity, chronic pain syndrome, DM, Sjogren's syndrome    Examination-Activity Limitations  Bathing;Locomotion Level;Transfers;Bed Mobility;Reach Overhead;Bend;Self Feeding;Caring for Others;Sit;Carry;Sleep;Continence;Squat;Dressing;Stairs;Hygiene/Grooming;Stand;Toileting;Lift    Examination-Participation Restrictions  Church;Yard Work;Cleaning;Meal Prep;Community Activity;Personal Finances;Driving;School;Interpersonal Relationship;Shop;Laundry;Volunteer    Stability/Clinical Decision Making  Evolving/Moderate complexity    Rehab Potential  Fair    PT Frequency   2x / week    PT Duration  3 weeks    PT Treatment/Interventions  ADLs/Self Care Home Management;Biofeedback;Cryotherapy;Electrical Stimulation;Iontophoresis '4mg'$ /ml Dexamethasone;Moist Heat;Traction;Ultrasound;Gait training;Stair training;Functional mobility training;Therapeutic activities;Therapeutic exercise;Balance  training;Neuromuscular re-education;Patient/family education;Manual techniques;Dry needling;Energy conservation;Taping;Visual/perceptual remediation/compensation;Joint Manipulations;Spinal Manipulations    PT Next Visit Plan  Continue with current POC completing exercises in seated/standing due to inablity to lay as increases pain.  Also referral for lymphedema treatments when order received/back treatment is concluded.    PT Home Exercise Plan  Eval: meditation plan, TA sets seated, TA marching seated; 8/6:  sitting as tall as possible, glut set, hip adduction set,       Patient will benefit from skilled therapeutic intervention in order to improve the following deficits and impairments:  Abnormal gait, Decreased coordination, Decreased range of motion, Difficulty walking, Increased fascial restricitons, Decreased safety awareness, Decreased endurance, Decreased activity tolerance, Impaired perceived functional ability, Obesity, Pain, Decreased balance, Hypomobility, Impaired flexibility, Improper body mechanics, Decreased mobility, Decreased strength, Postural dysfunction  Visit Diagnosis: Muscle weakness (generalized)  Difficulty in walking, not elsewhere classified  Chronic bilateral low back pain with bilateral sciatica     Problem List Patient Active Problem List   Diagnosis Date Noted  . Sjogren's syndrome with keratoconjunctivitis sicca ([place]) 04/22/2018  . Primary osteoarthritis of both hands 04/22/2018  . Primary osteoarthritis of both feet 04/22/2018  . DDD (degenerative disc disease), lumbar 04/22/2018  . Primary osteoarthritis of left shoulder 04/22/2018  .  Abdominal hernia without obstruction or gangrene 10/12/2016  . Acute pain of left shoulder 10/08/2016  . Varicose veins of bilateral lower extremities with other complications 74/60/0298  . Essential hypertension 03/16/2016  . Lymphedema 03/16/2016  . Breast cancer in female [facility]) 03/16/2016  . Hypothyroidism 03/16/2016  . 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 Prediabetes 03/16/2016  . Obesity 03/16/2016  . Hyperlipidemia 03/16/2016  . Chronic pain syndrome 03/16/2016   Teena Irani, PTA/CLT [phone]  Teena Irani 02/28/2019, 3:07 PM  [place] [address] [place], [place], [address] Phone: [phone]   Fax:  [phone]  Name: Elanah Osmanovic MRN: [id number] Date of Birth: 12/20/1940

## 2019-03-01 ENCOUNTER — Other Ambulatory Visit: Payer: Medicare Other

## 2019-03-07 ENCOUNTER — Telehealth (HOSPITAL_COMMUNITY): Payer: Self-pay | Admitting: Internal Medicine

## 2019-03-07 NOTE — Telephone Encounter (Signed)
03/07/19  Pt had left a message that she needed to schedule more appts.  I looked at her chart and she needs 6 more scheduled.  When I called she said that she wasn't at home and would have to call us back.

## 2019-03-08 IMAGING — CT CT ABD-PELV W/ CM
2 of 5 series · 15 of 46 positions shown, 17 images · IV contrast (Isovue)
Comparison: None.

CLINICAL DATA: Asymmetry lower abdomen on the right with bulge.
Question hernia.

EXAM:
CT ABDOMEN AND PELVIS WITH CONTRAST
TECHNIQUE: Multidetector CT imaging of the abdomen and pelvis was performed
using the standard protocol following bolus administration of
intravenous contrast.
CONTRAST:  100mL TMA10G-2MM IOPAMIDOL (TMA10G-2MM) INJECTION 61%

[Series 2: axial st · axial · 0.71mm/px · z∈[+951,+1281]mm · 12 of 76 slices shown, 14 images]
[im 5/76  soft-tissue]
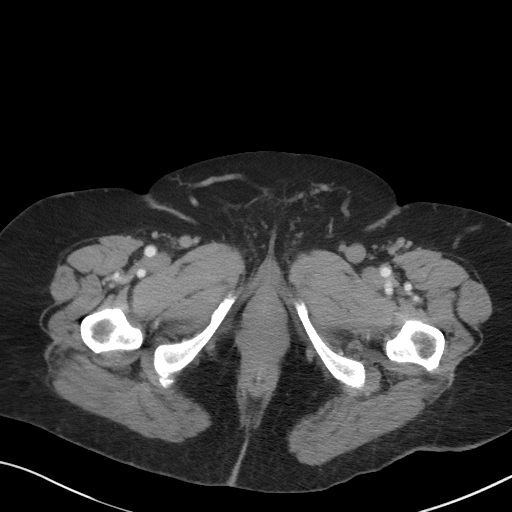
[im 5/76  bone]
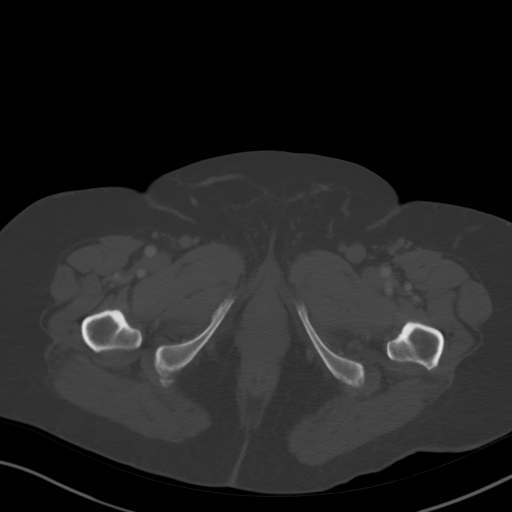
[im 10/76  soft-tissue]
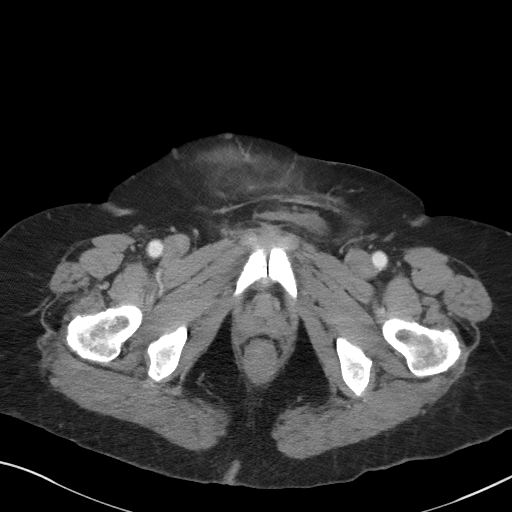
[im 19/76  soft-tissue]
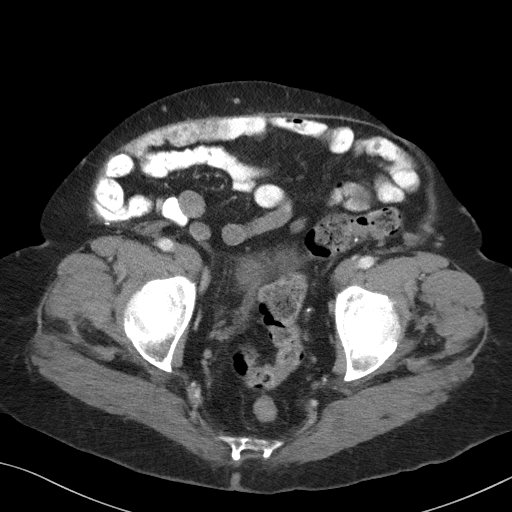
[im 24/76  soft-tissue]
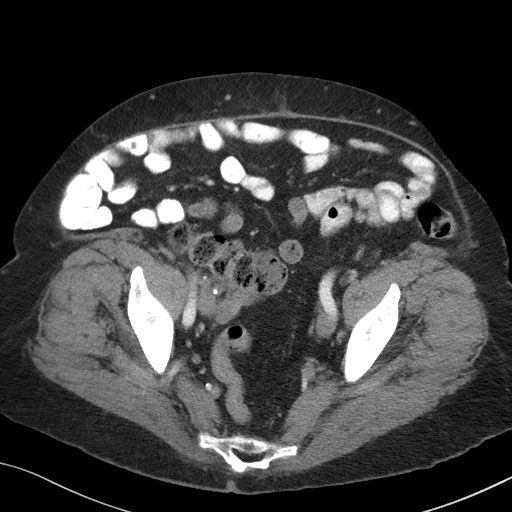
[im 29/76  soft-tissue]
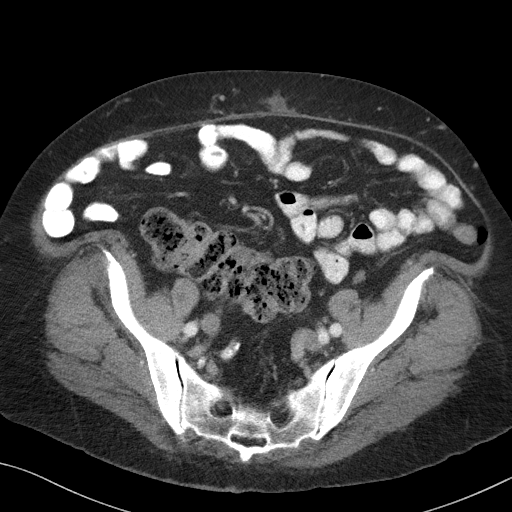
[im 33/76  soft-tissue]
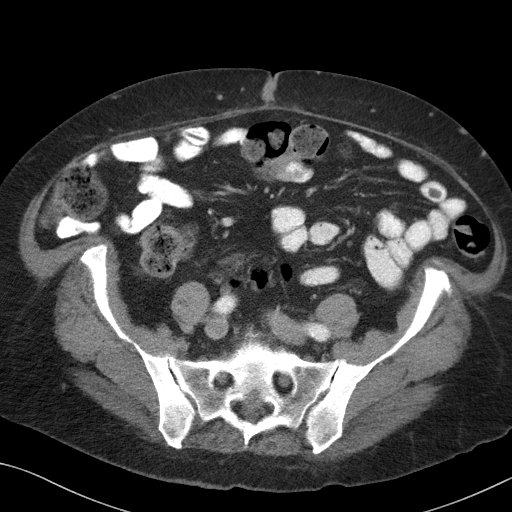
[im 43/76  soft-tissue]
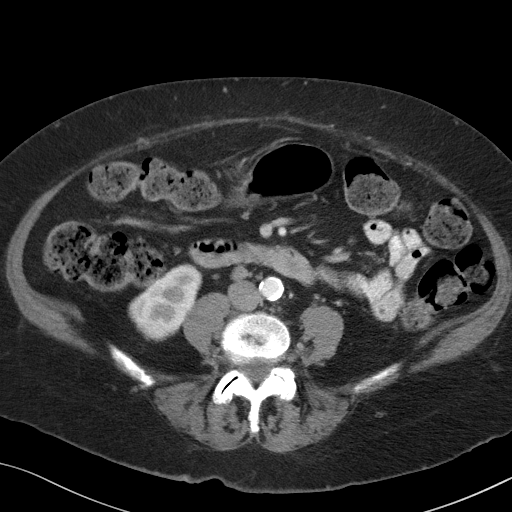
[im 47/76  soft-tissue]
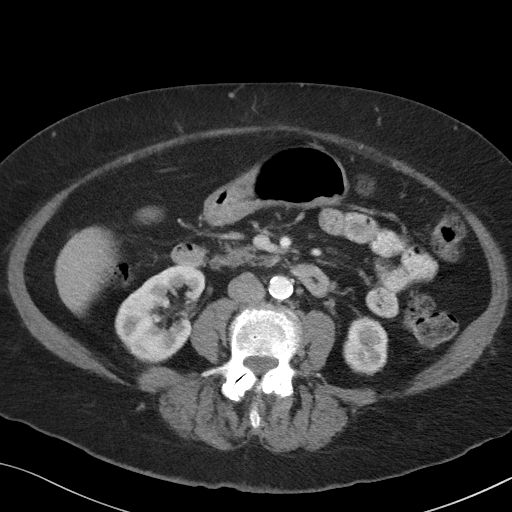
[im 52/76  soft-tissue]
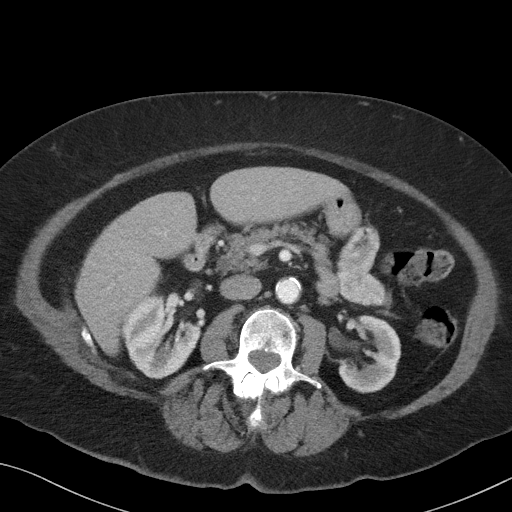
[im 52/76  bone]
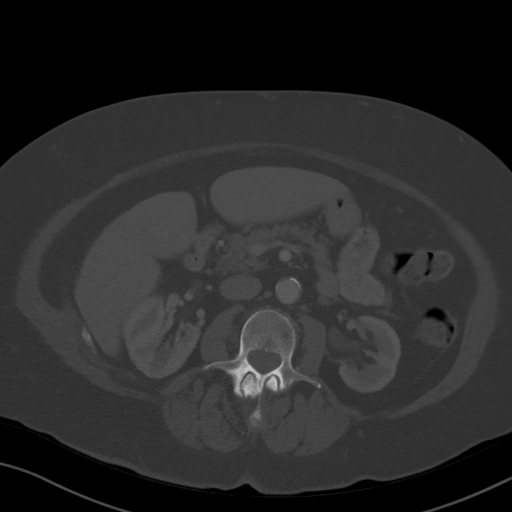
[im 57/76  soft-tissue]
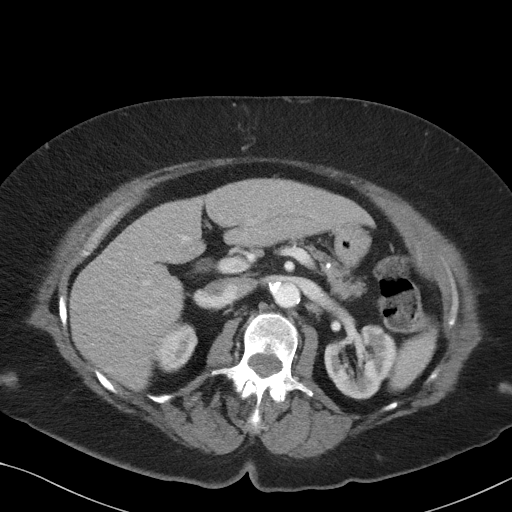
[im 66/76  soft-tissue]
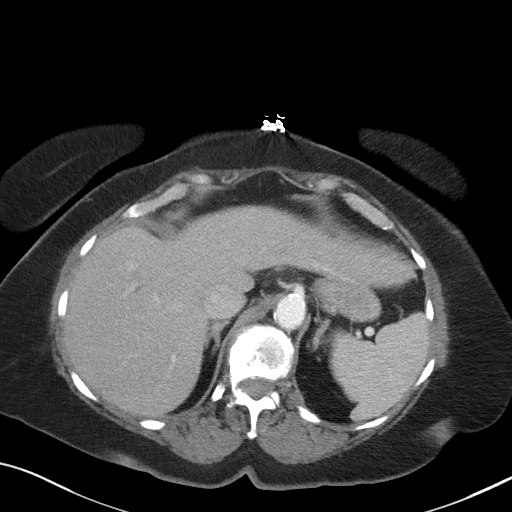
[im 71/76  soft-tissue]
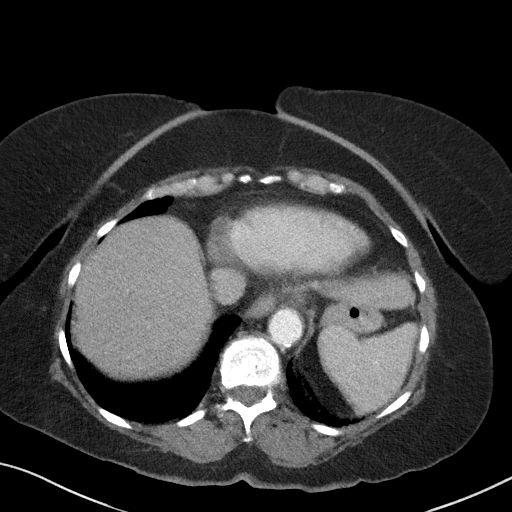

[Series 5: coronal st · coronal · 0.79mm/px · 3 of 103 slices shown]
[im 35/103  soft-tissue]
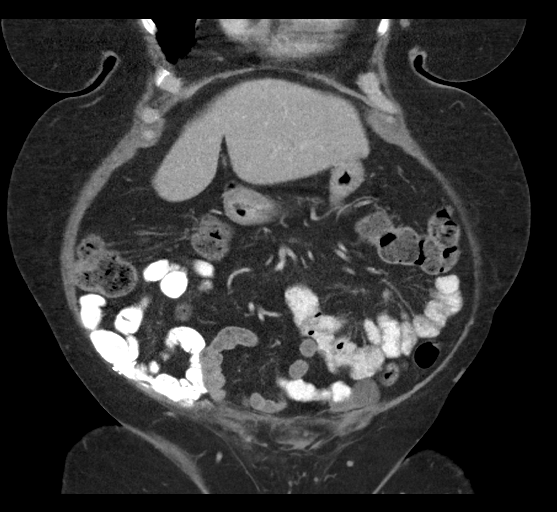
[im 46/103  soft-tissue]
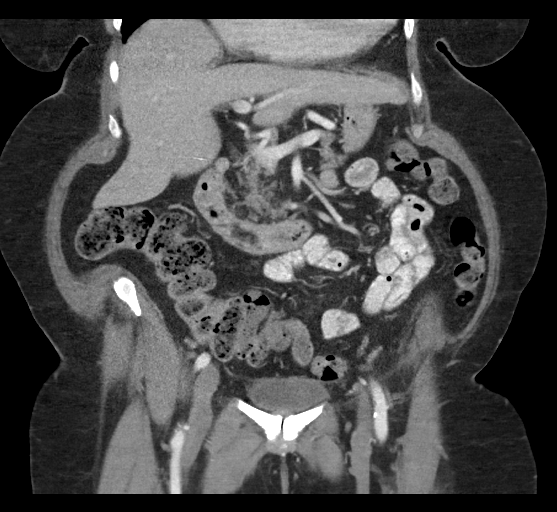
[im 57/103  soft-tissue]
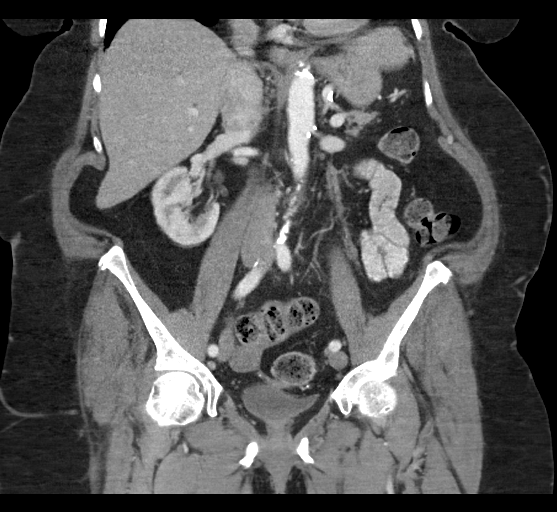

[15 of 46 positions shown; findings below may reference images not displayed]

FINDINGS: Lower chest: Minimal dependent atelectasis at the lung bases. Heart
at the upper limits normal in size. No pleural fluid.

Hepatobiliary: A few scattered tiny hepatic hypodensities are too
small to accurately characterize. Elongated left hepatic lobe.
Postcholecystectomy with biliary prominence, common bile duct
measures 11 mm at the porta hepatis. No calcified
choledocholithiasis.

Pancreas: Parenchymal atrophy and fatty infiltration. No ductal
dilatation or inflammation.

Spleen: Normal in size without focal abnormality.

Adrenals/Urinary Tract: No adrenal nodule. Symmetric renal
enhancement and excretion on delayed phase imaging. Tiny
subcentimeter cortical hypodensities in the left kidney are too
small to characterize. There is laxity of the lower anterior
abdominal wall musculature. Urinary bladder extends into the
anterior abdominal pannus. No bladder wall thickening.

Stomach/Bowel: The stomach is physiologically distended. No small
bowel obstruction, wall thickening or inflammation. There is laxity
of lower anterior abdominal wall musculature with nonobstructed
noninflamed bowel in the abdominal pannus. Moderate stool burden
throughout the colon with colonic tortuosity. Cecum located in the
central pelvis, the appendix is not visualized.

Vascular/Lymphatic: Aortic atherosclerosis and tortuosity. No
aneurysm. Small retroperitoneal lymph nodes. A tubular structure
anterior to the IVC is likely the right ovarian vein.

Reproductive: Post hysterectomy. There is a 3 cm cyst in the left
adnexa likely ovarian. Right ovary is tentatively identified
measuring approximately 2.9 cm.

Other: There is laxity of the anterior abdominal wall musculature
with abdominal pannus containing normal appearing small bowel and a
portion of the urinary bladder. Asymmetry of the lower anterior
abdominal wall appears is secondary to asymmetric subcutaneous fat.
No frank abdominal wall hernia. No ascites or free air.

Musculoskeletal: Multilevel degenerative change throughout the
lumbar spine with degenerative disc disease and facet arthropathy.
Degenerative disc disease appears prominent at L5-S1 and T12-L1.
Sclerosis of the pubic symphysis likely osteitis pubis.
IMPRESSION: 1. Laxity of the anterior abdominal musculature with lower abdominal
pannus containing normal small bowel portion of bladder. No frank
abdominal wall hernia. Asymmetry of subcutaneous fat of the anterior
lower abdominal wall with right increased compared to left, may
account for the clinical asymmetry.
2. Post hysterectomy. There is a 3 cm left adnexal cyst.
Postmenopausal patient, yearly sonographic follow-up is recommended.
3. Abdominal aortic atherosclerosis.  No aneurysm.

## 2019-03-13 ENCOUNTER — Telehealth (HOSPITAL_COMMUNITY): Payer: Self-pay

## 2019-03-13 DIAGNOSIS — L821 Other seborrheic keratosis: Secondary | ICD-10-CM | POA: Diagnosis not present

## 2019-03-13 DIAGNOSIS — B078 Other viral warts: Secondary | ICD-10-CM | POA: Diagnosis not present

## 2019-03-13 NOTE — Telephone Encounter (Signed)
will be out of the office had to cancel the appt and reschedule.

## 2019-03-15 DIAGNOSIS — E039 Hypothyroidism, unspecified: Secondary | ICD-10-CM | POA: Diagnosis not present

## 2019-03-15 DIAGNOSIS — R600 Localized edema: Secondary | ICD-10-CM | POA: Diagnosis not present

## 2019-03-15 DIAGNOSIS — E782 Mixed hyperlipidemia: Secondary | ICD-10-CM | POA: Diagnosis not present

## 2019-03-15 DIAGNOSIS — I10 Essential (primary) hypertension: Secondary | ICD-10-CM | POA: Diagnosis not present

## 2019-03-17 DIAGNOSIS — M5136 Other intervertebral disc degeneration, lumbar region: Secondary | ICD-10-CM | POA: Diagnosis not present

## 2019-03-21 ENCOUNTER — Other Ambulatory Visit: Payer: Self-pay

## 2019-03-21 ENCOUNTER — Ambulatory Visit (HOSPITAL_COMMUNITY): Payer: Medicare Other | Admitting: Physical Therapy

## 2019-03-21 DIAGNOSIS — M5442 Lumbago with sciatica, left side: Secondary | ICD-10-CM | POA: Diagnosis not present

## 2019-03-21 DIAGNOSIS — R262 Difficulty in walking, not elsewhere classified: Secondary | ICD-10-CM

## 2019-03-21 DIAGNOSIS — M5441 Lumbago with sciatica, right side: Secondary | ICD-10-CM | POA: Diagnosis not present

## 2019-03-21 DIAGNOSIS — M6281 Muscle weakness (generalized): Secondary | ICD-10-CM | POA: Diagnosis not present

## 2019-03-21 DIAGNOSIS — G8929 Other chronic pain: Secondary | ICD-10-CM

## 2019-03-21 NOTE — Therapy (Signed)
[facility] [address] [place], [place], [address] Phone: [phone]   Fax:  [phone]  Physical Therapy Treatment and Progress Note  Patient Details  Name: Teresa Anderson MRN: [id number] Date of Birth: [birth year] Referring Provider (PT): Suella Broad   Progress Note Reporting Period 02/15/19  to 03/21/19  See note below for Objective Data and Assessment of Progress/Goals.       Encounter Date: 03/21/2019  PT End of Session - 03/21/19 1717    Visit Number  7    Number of Visits  15   additional 3 visits added to original visit count   Date for PT Re-Evaluation  02/17/19    Authorization Type  [employer]    Authorization Time Period  02/24/99 to 3/49/17; cert 9/15-0/56/97, NEW CERT 9/48/01 to 65/5/37    Authorization - Visit Number  0    Authorization - Number of Visits  8    PT Start Time  4827    PT Stop Time  1615    PT Time Calculation (min)  44 min    Activity Tolerance  Patient limited by pain;No increased pain;Patient tolerated treatment well    Behavior During Therapy  [facility] for tasks assessed/performed       Past Medical History:  Diagnosis Date  . Allergy   . Arthritis   . Asthma   . Blood transfusion without reported diagnosis    with cancer surgery  . Cancer ([place])    breast and skin  . Diabetes mellitus without complication ([place])   . Hyperlipidemia   . Hypertension   . Lymphedema of leg   . Thyroid disease   . Varicose veins of bilateral lower extremities with other complications     Past Surgical History:  Procedure Laterality Date  . ABDOMINAL HYSTERECTOMY    . BREAST SURGERY     bilat  . CESAREAN SECTION     x4  . CHOLECYSTECTOMY    . COSMETIC SURGERY     reconstruction  . MASTECTOMY     bilateral    There were no vitals filed for this visit.  Subjective Assessment - 03/21/19 1733    Subjective  pt states she has good and bad days.  Currently having a decent day with pain at 5/10.   States her MD wants her to have a myelogram but she is scared to do that. States she forgot to discuss lymphedema therapy with him.    Currently in Pain?  Yes    Pain Score  10-Worst pain ever    Pain Location  Back    Pain Orientation  Posterior;Left;Right    Pain Descriptors / Indicators  Aching;Throbbing;Stabbing;Spasm;Shooting;Sharp;Restless;Radiating    Pain Type  Chronic pain    Pain Radiating Towards  all the way to her feet    Pain Onset  More than a month ago    Pain Frequency  Constant    Aggravating Factors   everything    Pain Relieving Factors  nothing    Effect of Pain on Daily Activities  severe         [facility] PT Assessment - 03/21/19 0001      Posture/Postural Control   Posture/Postural Control  Postural limitations    Postural Limitations  Rounded Shoulders;Forward head;Flexed trunk      AROM   Lumbar Flexion  Able to touch toes with fingers, pain   states she is dizzy   Lumbar Extension  90% limited pain  Lumbar - Right Side Bend  75% limited, pain    Lumbar - Left Side Bend  75% limited, pain      Strength   Right Hip Flexion  3+/5    Right Hip Extension  --   pain   Left Hip Flexion  3+/5   pain   Right Knee Extension  3+/5   pain   Left Knee Flexion  3-/5   pain   Left Knee Extension  3+/5   pain   Right Ankle Dorsiflexion  4+/5    Left Ankle Dorsiflexion  4+/5      Ambulation/Gait   Ambulation/Gait  Yes    Ambulation/Gait Assistance  7: Independent    Ambulation Distance (Feet)  408 Feet    Assistive device  None    Gait Pattern  Step-through pattern;Decreased arm swing - right;Decreased arm swing - left;Decreased dorsiflexion - right;Decreased dorsiflexion - left;Trendelenburg;Antalgic;Decreased trunk rotation;Trunk flexed;Right circumduction    Gait Comments  2MWT                   [facility] Adult PT Treatment/Exercise - 03/21/19 0001      Lumbar Exercises: Seated   Other Seated Lumbar Exercises  self mobilization with massage  stick in sitting, self mobilization with tennis ball at wall with pillow ccase - not tolerated as well              PT Education - 03/21/19 1734    Education Details  educated patient on lymphedema and importance of compliance with frequency for optimal results. Educated patient on deep breathing exercises - focus on exhale to down regulate SNS. Educated patient on pro and anti-inflammatory foods. Educated patient on self mobilization techniques.    Person(s) Educated  Patient    Methods  Explanation    Comprehension  Verbalized understanding       PT Short Term Goals - 03/21/19 1539      PT SHORT TERM GOAL #1   Title  Patient to be compliant with progressive meditation routine in order to assist in reducing anxiety and psychosocial impact of lumbar pain    Baseline  9/22:  states she now writes down the time she takes her medications    Time  3    Period  Weeks    Status  Achieved      PT SHORT TERM GOAL #2   Title  Patient to be compliant with sleep hygiene regimen in order to improve sleep and promote physiological/biochemical components of sleep that work to reduce pain and inflammation    Baseline  9/22 states that she is trying to establish regiment but is still in lots of pain and is up many hours during the night    Time  3    Period  Weeks    Status  On-going      PT SHORT TERM GOAL #3   Title  Patient to report she has been able to sleep at least 3 hours without waking due to pain in order to show improved quality of life    Baseline  9/22 states she sleeps in which allows her to get the 3 hours of sleep    Time  3    Period  Weeks    Status  Achieved      PT SHORT TERM GOAL #4   Title  Patient to demonstrate only moderate restrictions in lumbar mobility and consistent ability to perform sustained TA set during exercise in order to  show improving tolerance to gross activity    Baseline  03/21/19 - continued lumbar mobility restrictions    Status  On-going         PT Long Term Goals - 03/21/19 1716      PT LONG TERM GOAL #1   Title  Patient to demonstrate at least a 1 MMT grade improvement in order to show improved functional muscle strength and improve functional task tolerance    Baseline  9/20/ not improved on this date - severe pain reported during today's session    Time  4    Period  Weeks    Status  New      PT LONG TERM GOAL #2   Title  Patient to demonstrate correct functional mechanics for bed mobility in order to avoid aggravating back pain    Baseline  03/19/19 - not assessed secondary to severe pain    Time  4    Period  Weeks    Status  New      PT LONG TERM GOAL #3   Title  Patient to be compliant with progressive walking program to improve general fitness and promote long term mobility and pain relief    Baseline  03/19/19 not currently met but patient reports getting up and walking around as much as possible throughout the day    Time  [place] - 03/21/19 1723    Clinical Impression Statement  Patient presents to physical therapy for the first time in 3 weeks. She has been unable to attend physical therapy sessions regularly due to severe debilitating pain. Patient followed up with her MD and he would like to try spinal injections to see if they help with her pain symptoms. Educated patient on pain management strategies of deep breathing and self-mobilization to her muscles with tennis ball or massage stick. Also educated importance of compliance with frequency of lymphedema treatments with PT if she chooses to pursue PT for lymphedema. Extending POC secondary to inability to attend PT regularly over the last month and anticipate possible decrease in symptoms with upcoming spinal injections. Adding additional 3 visits to visit count (currently used 7/12), for 8 visits total to be used over the next 6 weeks at a frequency of 1-2 times a week. Patient highly motivated to stay active  and decrease pain symptoms, but very defeated in current functional status and chronic pain situation. Will focus on pain management strategies to help improve functional mobility and endurance.    Personal Factors and Comorbidities  Age;Behavior Pattern;Fitness;Past/Current Experience;Comorbidity 3+;Sex;Social Background;Education;Finances;Time since onset of injury/illness/exacerbation    Comorbidities  obesity, chronic pain syndrome, DM, Sjogren's syndrome    Examination-Activity Limitations  Bathing;Locomotion Level;Transfers;Bed Mobility;Reach Overhead;Bend;Self Feeding;Caring for Others;Sit;Carry;Sleep;Continence;Squat;Dressing;Stairs;Hygiene/Grooming;Stand;Toileting;Lift    Examination-Participation Restrictions  Church;Yard Work;Cleaning;Meal Prep;Community Activity;Personal Finances;Driving;School;Interpersonal Relationship;Shop;Laundry;Volunteer    Stability/Clinical Decision Making  Evolving/Moderate complexity    Rehab Potential  Fair    PT Frequency  Other (comment)   1-2x/week for a total of 8 visits (additional 3 visits added onto original visit count) over 6 week certification period   PT Duration  6 weeks    PT Treatment/Interventions  ADLs/Self Care Home Management;Biofeedback;Cryotherapy;Electrical Stimulation;Iontophoresis '4mg'$ /ml Dexamethasone;Moist Heat;Traction;Ultrasound;Gait training;Stair training;Functional mobility training;Therapeutic activities;Therapeutic exercise;Balance training;Neuromuscular re-education;Patient/family education;Manual techniques;Dry needling;Energy conservation;Taping;Visual/perceptual remediation/compensation;Joint Manipulations;Spinal Manipulations    PT Next Visit Plan  STM/Self mobilization as tolerated. walking/cardio, gravity eliminated movements,  deep breathing    PT Home Exercise Plan  9/22- deep breathing/meditation plan, self mobilization with rolling pin and tennis ball       Patient will benefit from skilled therapeutic intervention in  order to improve the following deficits and impairments:  Abnormal gait, Decreased coordination, Decreased range of motion, Difficulty walking, Increased fascial restricitons, Decreased safety awareness, Decreased endurance, Decreased activity tolerance, Impaired perceived functional ability, Obesity, Pain, Decreased balance, Hypomobility, Impaired flexibility, Improper body mechanics, Decreased mobility, Decreased strength, Postural dysfunction  Visit Diagnosis: Muscle weakness (generalized)  Difficulty in walking, not elsewhere classified  Chronic bilateral low back pain with bilateral sciatica     Problem List Patient Active Problem List   Diagnosis Date Noted  . Sjogren's syndrome with keratoconjunctivitis sicca ([place]) 04/22/2018  . Primary osteoarthritis of both hands 04/22/2018  . Primary osteoarthritis of both feet 04/22/2018  . DDD (degenerative disc disease), lumbar 04/22/2018  . Primary osteoarthritis of left shoulder 04/22/2018  . Abdominal hernia without obstruction or gangrene 10/12/2016  . Acute pain of left shoulder 10/08/2016  . Varicose veins of bilateral lower extremities with other complications 85/92/7639  . Essential hypertension 03/16/2016  . Lymphedema 03/16/2016  . Breast cancer in female [facility]) 03/16/2016  . Hypothyroidism 03/16/2016  . 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 Prediabetes 03/16/2016  . Obesity 03/16/2016  . Hyperlipidemia 03/16/2016  . Chronic pain syndrome 03/16/2016   5:41 PM, 03/21/19 Jerene Pitch, DPT Physical Therapy with [facility]  [phone] office  [place] [address] [place], [place], [address] Phone: [phone]   Fax:  [phone]  Name: Teresa Anderson MRN: [id number] Date of Birth: 05/29/1941

## 2019-03-22 NOTE — Addendum Note (Signed)
Addended by: Hunt Oris on: 03/22/2019 04:06 PM   Modules accepted: Orders

## 2019-03-24 ENCOUNTER — Ambulatory Visit (HOSPITAL_COMMUNITY): Payer: Medicare Other

## 2019-03-24 ENCOUNTER — Other Ambulatory Visit: Payer: Self-pay | Admitting: Physical Medicine and Rehabilitation

## 2019-03-24 ENCOUNTER — Telehealth (HOSPITAL_COMMUNITY): Payer: Self-pay

## 2019-03-24 DIAGNOSIS — M5136 Other intervertebral disc degeneration, lumbar region: Secondary | ICD-10-CM

## 2019-03-24 NOTE — Telephone Encounter (Signed)
No Show.  Called and spoke to pt who stated she had forgotten about apt today.  Stated she is high pain scale today that can cause her to forget things.  Reminded next apt date and time with contract. Encouraged pt to call and cancel/reschedule if unable to make it to apt.  [address], [place]; [facility] [phone]

## 2019-03-27 ENCOUNTER — Ambulatory Visit (HOSPITAL_COMMUNITY): Payer: Medicare Other | Admitting: Physical Therapy

## 2019-03-27 ENCOUNTER — Encounter (HOSPITAL_COMMUNITY): Payer: Self-pay | Admitting: Physical Therapy

## 2019-03-27 ENCOUNTER — Other Ambulatory Visit: Payer: Self-pay

## 2019-03-27 DIAGNOSIS — M5441 Lumbago with sciatica, right side: Secondary | ICD-10-CM | POA: Diagnosis not present

## 2019-03-27 DIAGNOSIS — M25551 Pain in right hip: Secondary | ICD-10-CM | POA: Diagnosis not present

## 2019-03-27 DIAGNOSIS — G8929 Other chronic pain: Secondary | ICD-10-CM | POA: Diagnosis not present

## 2019-03-27 DIAGNOSIS — M6281 Muscle weakness (generalized): Secondary | ICD-10-CM

## 2019-03-27 DIAGNOSIS — I890 Lymphedema, not elsewhere classified: Secondary | ICD-10-CM | POA: Diagnosis not present

## 2019-03-27 DIAGNOSIS — R262 Difficulty in walking, not elsewhere classified: Secondary | ICD-10-CM | POA: Diagnosis not present

## 2019-03-27 DIAGNOSIS — M5442 Lumbago with sciatica, left side: Secondary | ICD-10-CM

## 2019-03-27 DIAGNOSIS — G894 Chronic pain syndrome: Secondary | ICD-10-CM | POA: Diagnosis not present

## 2019-03-27 DIAGNOSIS — M797 Fibromyalgia: Secondary | ICD-10-CM | POA: Diagnosis not present

## 2019-03-27 DIAGNOSIS — R760 Raised antibody titer: Secondary | ICD-10-CM | POA: Diagnosis not present

## 2019-03-27 NOTE — Therapy (Signed)
[place], [place], [address] Phone: [phone]   Fax:  [phone]  Physical Therapy Treatment  Patient Details  Name: Teresa Anderson MRN: [id number] Date of Birth: Jan 01, 1941 Referring Provider (PT): Suella Broad    Encounter Date: 03/27/2019  PT End of Session - 03/27/19 1152    Visit Number  8    Number of Visits  15   additional 3 visits added to original visit count   Date for PT Re-Evaluation  02/17/19    Authorization Type  [employer] Medicare    Authorization Time Period  6/56/81 to 2/75/17; cert 0/01-7/49/44, NEW CERT 9/67/59 to 16/3/84    Authorization - Visit Number  1    Authorization - Number of Visits  8    PT Start Time  1130    PT Stop Time  1208    PT Time Calculation (min)  38 min    Activity Tolerance  Patient limited by pain;No increased pain;Patient tolerated treatment well    Behavior During Therapy  [facility] for tasks assessed/performed       Past Medical History:  Diagnosis Date  . Allergy   . Arthritis   . Asthma   . Blood transfusion without reported diagnosis    with cancer surgery  . Cancer ([place])    breast and skin  . Diabetes mellitus without complication ([place])   . Hyperlipidemia   . Hypertension   . Lymphedema of leg   . Thyroid disease   . Varicose veins of bilateral lower extremities with other complications     Past Surgical History:  Procedure Laterality Date  . ABDOMINAL HYSTERECTOMY    . BREAST SURGERY     bilat  . CESAREAN SECTION     x4  . CHOLECYSTECTOMY    . COSMETIC SURGERY     reconstruction  . MASTECTOMY     bilateral    There were no vitals filed for this visit.  Subjective Assessment - 03/27/19 1143    Subjective  States she has been in a lot of pain. reports that everything hurts, nothing feels good. States she tried the ball exercise and the breathing exercise and didn't notice too much of a difference. Patient reports that she is getting an  injection in her low back on 04/06/19    Currently in Pain?  Yes    Pain Score  8     Pain Location  Other (Comment)   all over, legs, back everywhere   Pain Descriptors / Indicators  Aching;Sharp;Radiating;Pounding;Constant    Pain Type  Chronic pain    Pain Onset  More than a month ago                       [facility] Adult PT Treatment/Exercise - 03/27/19 0001      Exercises   Exercises  Other Exercises      Lumbar Exercises: Aerobic   Nustep  5 minutes    Other Aerobic Exercise  walking - 1 lap 226 feet      Lumbar Exercises: Seated   Other Seated Lumbar Exercises  deep breathing exercises - guided by PT - in sitting - EC    Other Seated Lumbar Exercises  lumbar flexion with feet elevated on 4" step - intermittent active lumbar flexion with forward bending - 2x12      Modalities   Modalities  Moist Heat      Moist Heat Therapy  Number Minutes Moist Heat  6 Minutes    Moist Heat Location  Lumbar Spine;Other (comment)   in seated position - during deep breathing exercises.             PT Education - 03/27/19 1439    Education Details  on current pain, finding appropriate position or exercise for patient, continued deep breat work (tendency to hold breath and take shallow exhales, even with cueing)    Person(s) Educated  Patient    Methods  Explanation;Demonstration    Comprehension  Verbalized understanding       PT Short Term Goals - 03/21/19 1539      PT SHORT TERM GOAL #1   Title  Patient to be compliant with progressive meditation routine in order to assist in reducing anxiety and psychosocial impact of lumbar pain    Baseline  9/22:  states she now writes down the time she takes her medications    Time  3    Period  Weeks    Status  Achieved      PT SHORT TERM GOAL #2   Title  Patient to be compliant with sleep hygiene regimen in order to improve sleep and promote physiological/biochemical components of sleep that work to reduce pain and  inflammation    Baseline  9/22 states that she is trying to establish regiment but is still in lots of pain and is up many hours during the night    Time  3    Period  Weeks    Status  On-going      PT SHORT TERM GOAL #3   Title  Patient to report she has been able to sleep at least 3 hours without waking due to pain in order to show improved quality of life    Baseline  9/22 states she sleeps in which allows her to get the 3 hours of sleep    Time  3    Period  Weeks    Status  Achieved      PT SHORT TERM GOAL #4   Title  Patient to demonstrate only moderate restrictions in lumbar mobility and consistent ability to perform sustained TA set during exercise in order to show improving tolerance to gross activity    Baseline  03/21/19 - continued lumbar mobility restrictions    Status  On-going        PT Long Term Goals - 03/21/19 1716      PT LONG TERM GOAL #1   Title  Patient to demonstrate at least a 1 MMT grade improvement in order to show improved functional muscle strength and improve functional task tolerance    Baseline  9/20/ not improved on this date - severe pain reported during today's session    Time  4    Period  Weeks    Status  New      PT LONG TERM GOAL #2   Title  Patient to demonstrate correct functional mechanics for bed mobility in order to avoid aggravating back pain    Baseline  03/19/19 - not assessed secondary to severe pain    Time  4    Period  Weeks    Status  New      PT LONG TERM GOAL #3   Title  Patient to be compliant with progressive walking program to improve general fitness and promote long term mobility and pain relief    Baseline  03/19/19 not currently met but patient reports getting up  and walking around as much as possible throughout the day    Time  4    Period  Weeks    Status  New            Plan - 03/27/19 1152    Clinical Impression Statement  Patient did not tolerate session well. Unable to lay on table (side lying or on  back), patient attempted but did not make it past partial side lying without reports of severe pain that shot through her entire body. Attempted seated exercise with moist heat across lumbar spine. Heat initially felt good on lumbar spine, but this too started to bother her. Reports of continued pain in lower extremities noted throughout session. Able to tolerate one lap walking around clinic and 5 minutes on the NuStep. Will continue to attempt different interventions in different positions to determine most appropriate interventions for paint at this time.    Personal Factors and Comorbidities  Age;Behavior Pattern;Fitness;Past/Current Experience;Comorbidity 3+;Sex;Social Background;Education;Finances;Time since onset of injury/illness/exacerbation    Comorbidities  obesity, chronic pain syndrome, DM, Sjogren's syndrome    Examination-Activity Limitations  Bathing;Locomotion Level;Transfers;Bed Mobility;Reach Overhead;Bend;Self Feeding;Caring for Others;Sit;Carry;Sleep;Continence;Squat;Dressing;Stairs;Hygiene/Grooming;Stand;Toileting;Lift    Examination-Participation Restrictions  Church;Yard Work;Cleaning;Meal Prep;Community Activity;Personal Finances;Driving;School;Interpersonal Relationship;Shop;Laundry;Volunteer    Stability/Clinical Decision Making  Evolving/Moderate complexity    Rehab Potential  Fair    PT Frequency  Other (comment)   1-2x/week for a total of 8 visits (additional 3 visits added onto original visit count) over 6 week certification period   PT Duration  6 weeks    PT Treatment/Interventions  ADLs/Self Care Home Management;Biofeedback;Cryotherapy;Electrical Stimulation;Iontophoresis 6m/ml Dexamethasone;Moist Heat;Traction;Ultrasound;Gait training;Stair training;Functional mobility training;Therapeutic activities;Therapeutic exercise;Balance training;Neuromuscular re-education;Patient/family education;Manual techniques;Dry needling;Energy conservation;Taping;Visual/perceptual  remediation/compensation;Joint Manipulations;Spinal Manipulations    PT Next Visit Plan  STM/Self mobilization as tolerated. walking/cardio, deep breathing    PT Home Exercise Plan  9/22- deep breathing/meditation plan, self mobilization with rolling pin and tennis ball       Patient will benefit from skilled therapeutic intervention in order to improve the following deficits and impairments:  Abnormal gait, Decreased coordination, Decreased range of motion, Difficulty walking, Increased fascial restricitons, Decreased safety awareness, Decreased endurance, Decreased activity tolerance, Impaired perceived functional ability, Obesity, Pain, Decreased balance, Hypomobility, Impaired flexibility, Improper body mechanics, Decreased mobility, Decreased strength, Postural dysfunction  Visit Diagnosis: Muscle weakness (generalized)  Difficulty in walking, not elsewhere classified  Chronic bilateral low back pain with bilateral sciatica     Problem List Patient Active Problem List   Diagnosis Date Noted  . Sjogren's syndrome with keratoconjunctivitis sicca ([place] 04/22/2018  . Primary osteoarthritis of both hands 04/22/2018  . Primary osteoarthritis of both feet 04/22/2018  . DDD (degenerative disc disease), lumbar 04/22/2018  . Primary osteoarthritis of left shoulder 04/22/2018  . Abdominal hernia without obstruction or gangrene 10/12/2016  . Acute pain of left shoulder 10/08/2016  . Varicose veins of bilateral lower extremities with other complications 103/00/9233 . Essential hypertension 03/16/2016  . Lymphedema 03/16/2016  . Breast cancer in female ([facility] 03/16/2016  . Hypothyroidism 03/16/2016  . 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 Prediabetes 03/16/2016  . Obesity 03/16/2016  . Hyperlipidemia 03/16/2016  . Chronic pain syndrome 03/16/2016   2:41 PM, 03/27/19 MJerene Pitch DPT Physical Therapy with  [facility] [phone]  [place] [address]Phone: [phone]  Fax:  [phone] Name: Teresa Anderson  MRN: [id number] Date of Birth: 26-Apr-1941

## 2019-03-28 ENCOUNTER — Encounter (HOSPITAL_COMMUNITY): Payer: Medicare Other | Admitting: Physical Therapy

## 2019-03-29 ENCOUNTER — Ambulatory Visit (HOSPITAL_COMMUNITY): Payer: Medicare Other

## 2019-03-29 ENCOUNTER — Telehealth (HOSPITAL_COMMUNITY): Payer: Self-pay

## 2019-03-29 NOTE — Telephone Encounter (Signed)
No show.  Accidently left voice message on phone before saw request for no messages.  Did remind pt of next apt date and time as well as contact information.  This is 2nd no show though not consective.    [address] [place]; [facility] [phone]

## 2019-03-30 ENCOUNTER — Ambulatory Visit (HOSPITAL_COMMUNITY): Payer: Medicare Other

## 2019-03-31 ENCOUNTER — Encounter (HOSPITAL_COMMUNITY): Payer: Medicare Other

## 2019-04-03 ENCOUNTER — Other Ambulatory Visit: Payer: Self-pay

## 2019-04-03 ENCOUNTER — Encounter (HOSPITAL_COMMUNITY): Payer: Self-pay | Admitting: Physical Therapy

## 2019-04-03 ENCOUNTER — Ambulatory Visit (HOSPITAL_COMMUNITY): Payer: Medicare Other | Attending: Physical Medicine and Rehabilitation | Admitting: Physical Therapy

## 2019-04-03 DIAGNOSIS — M5441 Lumbago with sciatica, right side: Secondary | ICD-10-CM | POA: Insufficient documentation

## 2019-04-03 DIAGNOSIS — G8929 Other chronic pain: Secondary | ICD-10-CM

## 2019-04-03 DIAGNOSIS — M6281 Muscle weakness (generalized): Secondary | ICD-10-CM | POA: Insufficient documentation

## 2019-04-03 DIAGNOSIS — M5442 Lumbago with sciatica, left side: Secondary | ICD-10-CM | POA: Diagnosis not present

## 2019-04-03 DIAGNOSIS — R262 Difficulty in walking, not elsewhere classified: Secondary | ICD-10-CM | POA: Diagnosis not present

## 2019-04-03 NOTE — Therapy (Addendum)
[facility] [address] [place], [place], [address] Phone: [phone]   Fax:  [phone]  Physical Therapy Treatment and Discharge Note  Patient Details  Name: Teresa Anderson MRN: [id number] Date of Birth: Feb 22, 1941 Referring Provider (PT): Suella Broad    PHYSICAL THERAPY DISCHARGE SUMMARY  Visits from Start of Care: 8  Current functional level related to goals / functional outcomes: Unable to assess secondary to unplanned discharge   Remaining deficits: Unable to assess secondary to unplanned discharge   Education / Equipment: Unable to educate on DC as patient did not return  Plan: Patient agrees to discharge.  Patient goals were not met. Patient is being discharged due to not returning since the last visit.  ?????    10:18 AM, 03/27/20 Jerene Pitch, DPT Physical Therapy with [facility]  [phone] office      Encounter Date: 04/03/2019  PT End of Session - 04/03/19 0900    Visit Number  8    Number of Visits  15   additional 3 visits added to original visit count   Date for PT Re-Evaluation  02/17/19    Authorization Type  [employer] Medicare    Authorization Time Period  2/37/62 to 02/26/50; cert 7/61-6/07/37, NEW CERT 07/04/24 to 94/8/54    Authorization - Visit Number  2    Authorization - Number of Visits  8    PT Start Time  1330   pt late to session and needed to use restroom   PT Stop Time  1356    PT Time Calculation (min)  26 min    Activity Tolerance  Patient limited by pain;No increased pain;Patient tolerated treatment well    Behavior During Therapy  [facility] for tasks assessed/performed       Past Medical History:  Diagnosis Date  . Allergy   . Arthritis   . Asthma   . Blood transfusion without reported diagnosis    with cancer surgery  . Cancer ([place])    breast and skin  . Diabetes mellitus without complication ([place])   . Hyperlipidemia   . Hypertension   . Lymphedema of  leg   . Thyroid disease   . Varicose veins of bilateral lower extremities with other complications     Past Surgical History:  Procedure Laterality Date  . ABDOMINAL HYSTERECTOMY    . BREAST SURGERY     bilat  . CESAREAN SECTION     x4  . CHOLECYSTECTOMY    . COSMETIC SURGERY     reconstruction  . MASTECTOMY     bilateral    There were no vitals filed for this visit.  Subjective Assessment - 04/03/19 1328    Subjective  States she is doing horrible. Reports pain all over. States she even took extra medication today because of her pain. States that it has been really bad. States she missed her appointment last week because the pain is so bad. Reprots she forgets her name sometimes because of her pain. The pain is also waking her up in the middle of the night.    Currently in Pain?  Yes    Pain Score  10-Worst pain ever    Pain Location  Other (Comment)   all over body   Pain Descriptors / Indicators  Aching;Dull    Pain Type  Chronic pain    Pain Onset  More than a month ago  [place] Adult PT Treatment/Exercise - 04/03/19 0001      Ambulation/Gait   Ambulation/Gait  Yes    Ambulation/Gait Assistance  6: Modified independent (Device/Increase time)    Ambulation Distance (Feet)  226 Feet    Gait Comments  1:45 to complete      Lumbar Exercises: Aerobic   Nustep  5 minutes      Lumbar Exercises: Seated   Long Arc Quad on Chair  AROM;Strengthening;15 reps;Both   2 sets   Other Seated Lumbar Exercises  sit to stands x3 in reg chair    Other Seated Lumbar Exercises  hip add squeeze in ball 2x15, 5"holds               PT Short Term Goals - 03/21/19 1539      PT SHORT TERM GOAL #1   Title  Patient to be compliant with progressive meditation routine in order to assist in reducing anxiety and psychosocial impact of lumbar pain    Baseline  9/22:  states she now writes down the time she takes her medications    Time  3    Period   Weeks    Status  Achieved      PT SHORT TERM GOAL #2   Title  Patient to be compliant with sleep hygiene regimen in order to improve sleep and promote physiological/biochemical components of sleep that work to reduce pain and inflammation    Baseline  9/22 states that she is trying to establish regiment but is still in lots of pain and is up many hours during the night    Time  3    Period  Weeks    Status  On-going      PT SHORT TERM GOAL #3   Title  Patient to report she has been able to sleep at least 3 hours without waking due to pain in order to show improved quality of life    Baseline  9/22 states she sleeps in which allows her to get the 3 hours of sleep    Time  3    Period  Weeks    Status  Achieved      PT SHORT TERM GOAL #4   Title  Patient to demonstrate only moderate restrictions in lumbar mobility and consistent ability to perform sustained TA set during exercise in order to show improving tolerance to gross activity    Baseline  03/21/19 - continued lumbar mobility restrictions    Status  On-going        PT Long Term Goals - 03/21/19 1716      PT LONG TERM GOAL #1   Title  Patient to demonstrate at least a 1 MMT grade improvement in order to show improved functional muscle strength and improve functional task tolerance    Baseline  9/20/ not improved on this date - severe pain reported during today's session    Time  4    Period  Weeks    Status  New      PT LONG TERM GOAL #2   Title  Patient to demonstrate correct functional mechanics for bed mobility in order to avoid aggravating back pain    Baseline  03/19/19 - not assessed secondary to severe pain    Time  4    Period  Weeks    Status  New      PT LONG TERM GOAL #3   Title  Patient to be compliant with progressive walking program to  improve general fitness and promote long term mobility and pain relief    Baseline  03/19/19 not currently met but patient reports getting up and walking around as much as  possible throughout the day    Time  [place] - 04/03/19 1341    Clinical Impression Statement  Patient did not tolerate session well. High level of pain reported throughout session, patient needed long rest breaks between sets to manage pain. Patietn arrived late to session, but would not have tolerated a full session anyway secondary to pain. Patient would continue to benefit from skilled physical therapy focusing on LE strength and mobility as tolerated.    Personal Factors and Comorbidities  Age;Behavior Pattern;Fitness;Past/Current Experience;Comorbidity 3+;Sex;Social Background;Education;Finances;Time since onset of injury/illness/exacerbation    Comorbidities  obesity, chronic pain syndrome, DM, Sjogren's syndrome    Examination-Activity Limitations  Bathing;Locomotion Level;Transfers;Bed Mobility;Reach Overhead;Bend;Self Feeding;Caring for Others;Sit;Carry;Sleep;Continence;Squat;Dressing;Stairs;Hygiene/Grooming;Stand;Toileting;Lift    Examination-Participation Restrictions  Church;Yard Work;Cleaning;Meal Prep;Community Activity;Personal Finances;Driving;School;Interpersonal Relationship;Shop;Laundry;Volunteer    Stability/Clinical Decision Making  Evolving/Moderate complexity    Rehab Potential  Fair    PT Frequency  Other (comment)   1-2x/week for a total of 8 visits (additional 3 visits added onto original visit count) over 6 week certification period   PT Duration  6 weeks    PT Treatment/Interventions  ADLs/Self Care Home Management;Biofeedback;Cryotherapy;Electrical Stimulation;Iontophoresis 99m/ml Dexamethasone;Moist Heat;Traction;Ultrasound;Gait training;Stair training;Functional mobility training;Therapeutic activities;Therapeutic exercise;Balance training;Neuromuscular re-education;Patient/family education;Manual techniques;Dry needling;Energy conservation;Taping;Visual/perceptual remediation/compensation;Joint Manipulations;Spinal  Manipulations    PT Next Visit Plan  STM/Self mobilization as tolerated. walking/cardio, deep breathing, LE strengthening (seated/standing)    PT Home Exercise Plan  9/22- deep breathing/meditation plan, self mobilization with rolling pin and tennis ball       Patient will benefit from skilled therapeutic intervention in order to improve the following deficits and impairments:  Abnormal gait, Decreased coordination, Decreased range of motion, Difficulty walking, Increased fascial restricitons, Decreased safety awareness, Decreased endurance, Decreased activity tolerance, Impaired perceived functional ability, Obesity, Pain, Decreased balance, Hypomobility, Impaired flexibility, Improper body mechanics, Decreased mobility, Decreased strength, Postural dysfunction  Visit Diagnosis: Muscle weakness (generalized)  Difficulty in walking, not elsewhere classified  Chronic bilateral low back pain with bilateral sciatica     Problem List Patient Active Problem List   Diagnosis Date Noted  . Sjogren's syndrome with keratoconjunctivitis sicca ([place] 04/22/2018  . Primary osteoarthritis of both hands 04/22/2018  . Primary osteoarthritis of both feet 04/22/2018  . DDD (degenerative disc disease), lumbar 04/22/2018  . Primary osteoarthritis of left shoulder 04/22/2018  . Abdominal hernia without obstruction or gangrene 10/12/2016  . Acute pain of left shoulder 10/08/2016  . Varicose veins of bilateral lower extremities with other complications 116/03/9603 . Essential hypertension 03/16/2016  . Lymphedema 03/16/2016  . Breast cancer in female ([facility] 03/16/2016  . Hypothyroidism 03/16/2016  . 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 Prediabetes 03/16/2016  . Obesity 03/16/2016  . Hyperlipidemia 03/16/2016  . Chronic pain syndrome 03/16/2016    2:01 PM, 04/03/19 MJerene Pitch DPT Physical Therapy with  [facility] [phone]   [place] [place] [address] [address]Phone: [phone]  Fax:  [phone] Name: DJohnnye SandfordMRN: [id number] of Birth: 501-16-1942

## 2019-04-04 ENCOUNTER — Encounter (HOSPITAL_COMMUNITY): Payer: Medicare Other | Admitting: Physical Therapy

## 2019-04-04 ENCOUNTER — Other Ambulatory Visit: Payer: Medicare Other

## 2019-04-05 ENCOUNTER — Telehealth (HOSPITAL_COMMUNITY): Payer: Self-pay | Admitting: Internal Medicine

## 2019-04-05 ENCOUNTER — Ambulatory Visit (HOSPITAL_COMMUNITY): Payer: Medicare Other

## 2019-04-05 ENCOUNTER — Ambulatory Visit (HOSPITAL_COMMUNITY): Payer: Medicare Other | Admitting: Physical Therapy

## 2019-04-05 NOTE — Telephone Encounter (Signed)
04/05/19  pt called to cx said that she was getting a shot in her back and needed to reschedule this appt

## 2019-04-06 ENCOUNTER — Encounter (HOSPITAL_COMMUNITY): Payer: Medicare Other

## 2019-04-06 ENCOUNTER — Inpatient Hospital Stay: Admission: RE | Admit: 2019-04-06 | Payer: Medicare Other | Source: Ambulatory Visit

## 2019-04-06 DIAGNOSIS — J019 Acute sinusitis, unspecified: Secondary | ICD-10-CM | POA: Diagnosis not present

## 2019-04-06 DIAGNOSIS — E559 Vitamin D deficiency, unspecified: Secondary | ICD-10-CM | POA: Diagnosis not present

## 2019-04-06 DIAGNOSIS — E782 Mixed hyperlipidemia: Secondary | ICD-10-CM | POA: Diagnosis not present

## 2019-04-06 DIAGNOSIS — G4700 Insomnia, unspecified: Secondary | ICD-10-CM | POA: Diagnosis not present

## 2019-04-06 DIAGNOSIS — B379 Candidiasis, unspecified: Secondary | ICD-10-CM | POA: Diagnosis not present

## 2019-04-06 DIAGNOSIS — E039 Hypothyroidism, unspecified: Secondary | ICD-10-CM | POA: Diagnosis not present

## 2019-04-11 ENCOUNTER — Ambulatory Visit (HOSPITAL_COMMUNITY): Payer: Medicare Other | Admitting: Physical Therapy

## 2019-04-18 DIAGNOSIS — J019 Acute sinusitis, unspecified: Secondary | ICD-10-CM | POA: Diagnosis not present

## 2019-04-20 ENCOUNTER — Other Ambulatory Visit: Payer: Medicare Other

## 2019-04-20 ENCOUNTER — Ambulatory Visit
Admission: RE | Admit: 2019-04-20 | Discharge: 2019-04-20 | Disposition: A | Discharge status: Home / Self Care | Payer: Medicare Other | Source: Ambulatory Visit | Attending: Physical Medicine and Rehabilitation | Admitting: Physical Medicine and Rehabilitation

## 2019-04-20 DIAGNOSIS — M5136 Other intervertebral disc degeneration, lumbar region: Secondary | ICD-10-CM

## 2019-04-20 DIAGNOSIS — M545 Low back pain: Secondary | ICD-10-CM | POA: Diagnosis not present

## 2019-04-20 MED ORDER — IOPAMIDOL (ISOVUE-M 200) INJECTION 41%
1.0000 mL | Freq: Once | INTRAMUSCULAR | Status: AC
  Administered 2019-04-20: 1 mL via EPIDURAL

## 2019-04-20 MED ORDER — METHYLPREDNISOLONE ACETATE 40 MG/ML INJ SUSP (RADIOLOG
120.0000 mg | Freq: Once | INTRAMUSCULAR | Status: AC
  Administered 2019-04-20: 120 mg via EPIDURAL

## 2019-04-20 NOTE — Discharge Instructions (Signed)

## 2019-04-24 DIAGNOSIS — L309 Dermatitis, unspecified: Secondary | ICD-10-CM | POA: Diagnosis not present

## 2019-05-08 DIAGNOSIS — L309 Dermatitis, unspecified: Secondary | ICD-10-CM | POA: Diagnosis not present

## 2019-05-22 DIAGNOSIS — L309 Dermatitis, unspecified: Secondary | ICD-10-CM | POA: Diagnosis not present

## 2019-05-22 DIAGNOSIS — H00012 Hordeolum externum right lower eyelid: Secondary | ICD-10-CM | POA: Diagnosis not present

## 2019-05-22 DIAGNOSIS — G4762 Sleep related leg cramps: Secondary | ICD-10-CM | POA: Diagnosis not present

## 2019-05-22 DIAGNOSIS — G894 Chronic pain syndrome: Secondary | ICD-10-CM | POA: Diagnosis not present

## 2019-05-22 DIAGNOSIS — L84 Corns and callosities: Secondary | ICD-10-CM | POA: Diagnosis not present

## 2019-06-02 DIAGNOSIS — G4762 Sleep related leg cramps: Secondary | ICD-10-CM | POA: Diagnosis not present

## 2019-06-02 DIAGNOSIS — I10 Essential (primary) hypertension: Secondary | ICD-10-CM | POA: Diagnosis not present

## 2019-06-02 DIAGNOSIS — E7849 Other hyperlipidemia: Secondary | ICD-10-CM | POA: Diagnosis not present

## 2019-07-03 DIAGNOSIS — I10 Essential (primary) hypertension: Secondary | ICD-10-CM | POA: Diagnosis not present

## 2019-07-03 DIAGNOSIS — E7849 Other hyperlipidemia: Secondary | ICD-10-CM | POA: Diagnosis not present

## 2019-07-03 DIAGNOSIS — E782 Mixed hyperlipidemia: Secondary | ICD-10-CM | POA: Diagnosis not present

## 2019-07-11 DIAGNOSIS — I8002 Phlebitis and thrombophlebitis of superficial vessels of left lower extremity: Secondary | ICD-10-CM | POA: Diagnosis not present

## 2019-07-11 DIAGNOSIS — M79674 Pain in right toe(s): Secondary | ICD-10-CM | POA: Diagnosis not present

## 2019-07-12 ENCOUNTER — Other Ambulatory Visit (HOSPITAL_COMMUNITY): Payer: Self-pay | Admitting: Internal Medicine

## 2019-07-12 ENCOUNTER — Other Ambulatory Visit: Payer: Self-pay | Admitting: Internal Medicine

## 2019-07-12 DIAGNOSIS — I82409 Acute embolism and thrombosis of unspecified deep veins of unspecified lower extremity: Secondary | ICD-10-CM

## 2019-07-17 ENCOUNTER — Ambulatory Visit (HOSPITAL_COMMUNITY)
Admission: RE | Admit: 2019-07-17 | Discharge: 2019-07-17 | Disposition: A | Discharge status: Home / Self Care | Payer: Medicare Other | Source: Ambulatory Visit | Attending: Internal Medicine | Admitting: Internal Medicine

## 2019-07-17 ENCOUNTER — Other Ambulatory Visit: Payer: Self-pay

## 2019-07-17 DIAGNOSIS — R600 Localized edema: Secondary | ICD-10-CM | POA: Diagnosis not present

## 2019-07-17 DIAGNOSIS — I82409 Acute embolism and thrombosis of unspecified deep veins of unspecified lower extremity: Secondary | ICD-10-CM | POA: Diagnosis not present

## 2019-07-19 ENCOUNTER — Ambulatory Visit (INDEPENDENT_AMBULATORY_CARE_PROVIDER_SITE_OTHER): Payer: Medicare Other | Admitting: Podiatry

## 2019-07-19 ENCOUNTER — Ambulatory Visit (INDEPENDENT_AMBULATORY_CARE_PROVIDER_SITE_OTHER): Payer: Medicare Other

## 2019-07-19 ENCOUNTER — Other Ambulatory Visit: Payer: Self-pay

## 2019-07-19 DIAGNOSIS — M79671 Pain in right foot: Secondary | ICD-10-CM | POA: Diagnosis not present

## 2019-07-19 DIAGNOSIS — M2041 Other hammer toe(s) (acquired), right foot: Secondary | ICD-10-CM

## 2019-07-19 DIAGNOSIS — S99921S Unspecified injury of right foot, sequela: Secondary | ICD-10-CM | POA: Diagnosis not present

## 2019-07-19 NOTE — Patient Instructions (Signed)
Pre-Operative Instructions  Congratulations, you have decided to take an important step towards improving your quality of life.  You can be assured that the doctors and staff at [facility] will be with you every step of the way.  Here are some important things you should know:  1. Plan to be at the surgery center/hospital at least 1 (one) hour prior to your scheduled time, unless otherwise directed by the surgical center/hospital staff.  You must have a responsible adult accompany you, remain during the surgery and drive you home.  Make sure you have directions to the surgical center/hospital to ensure you arrive on time. 2. If you are having surgery at [facility] or [facility], you will need a copy of your medical history and physical form from your family physician within one month prior to the date of surgery. We will give you a form for your primary physician to complete.  3. We make every effort to accommodate the date you request for surgery.  However, there are times where surgery dates or times have to be moved.  We will contact you as soon as possible if a change in schedule is required.   4. No aspirin/ibuprofen for one week before surgery.  If you are on aspirin, any non-steroidal anti-inflammatory medications (Mobic, Aleve, Ibuprofen) should not be taken seven (7) days prior to your surgery.  You make take Tylenol for pain prior to surgery.  5. Medications - If you are taking daily heart and blood pressure medications, seizure, reflux, allergy, asthma, anxiety, pain or diabetes medications, make sure you notify the surgery center/hospital before the day of surgery so they can tell you which medications you should take or avoid the day of surgery. 6. No food or drink after midnight the night before surgery unless directed otherwise by surgical center/hospital staff. 7. No alcoholic beverages 24-hours prior to surgery.  No smoking 24-hours prior or 24-hours after  surgery. 8. Wear loose pants or shorts. They should be loose enough to fit over bandages, boots, and casts. 9. Don't wear slip-on shoes. Sneakers are preferred. 10. Bring your boot with you to the surgery center/hospital.  Also bring crutches or a walker if your physician has prescribed it for you.  If you do not have this equipment, it will be provided for you after surgery. 11. If you have not been contacted by the surgery center/hospital by the day before your surgery, call to confirm the date and time of your surgery. 12. Leave-time from work may vary depending on the type of surgery you have.  Appropriate arrangements should be made prior to surgery with your employer. 13. Prescriptions will be provided immediately following surgery by your doctor.  Fill these as soon as possible after surgery and take the medication as directed. Pain medications will not be refilled on weekends and must be approved by the doctor. 14. Remove nail polish on the operative foot and avoid getting pedicures prior to surgery. 15. Wash the night before surgery.  The night before surgery wash the foot and leg well with water and the antibacterial soap provided. Be sure to pay special attention to beneath the toenails and in between the toes.  Wash for at least three (3) minutes. Rinse thoroughly with water and dry well with a towel.  Perform this wash unless told not to do so by your physician.  Enclosed: 1 Ice pack (please put in freezer the night before surgery)   1 Hibiclens skin cleaner     Pre-op instructions  If you have any questions regarding the instructions, please do not hesitate to call our office.  [place]: [address], [place], [place] [address] -- [phone]  [place]: [address] [place], [place] [address] -- [phone]  [place]: [address], [place], [place] [address] -- [phone]   Website: [email or web address] 

## 2019-07-21 ENCOUNTER — Encounter: Payer: Self-pay | Admitting: Podiatry

## 2019-07-21 NOTE — Progress Notes (Signed)
Subjective:  Patient ID: Marcena Matchett, female    DOB: [birth year],  MRN: [id number]  Chief Complaint  Patient presents with  . Toe Pain    pt has a possible hammer toe of the right second toe, pt states that it has been going on for around 5 years, pain is aggravated when she is walking on it, pt also states that that the right second toe feels like a shooting sensation,     79 y.o. female presents with the above complaint.  Patient presents with multiple hammertoes of 2 through 5 on the right foot.  There is a medial deviation of the digits.  She states that they are causing her pain especially the second digit that is elevated on the metatarsal.  She states the rib rub against her shoes and has been going on for 5 years.  She states it is shooting pain especially when walking.  She also states the right second toe will move over the top of the right first toe and cause her a lot of pain.  She has tried all the conservative therapy including padding taping bracing but has not helped.  She would like to know if there is anything that can be done surgically to help improve the alignment of the toe and therefore reduction in pain.  She denies any other acute complaints.  Patient denies any history of rheumatoid arthritis.  Review of Systems: Negative except as noted in the HPI. Denies N/V/F/Ch.  Past Medical History:  Diagnosis Date  . Allergy   . Arthritis   . Asthma   . Blood transfusion without reported diagnosis    with cancer surgery  . Cancer ([place])    breast and skin  . Diabetes mellitus without complication ([place])   . Hyperlipidemia   . Hypertension   . Lymphedema of leg   . Thyroid disease   . Varicose veins of bilateral lower extremities with other complications     Current Outpatient Medications:  .  atenolol (TENORMIN) 50 MG tablet, TAKE (1) TABLET BY MOUTH ONCE DAILY., Disp: 90 tablet, Rfl: 0 .  atorvastatin (LIPITOR) 20 MG tablet, atorvastatin 20 mg tablet  TAKE 1 TABLET  BY MOUTH ONCE DAILY AT BEDTIME, Disp: , Rfl:  .  cholecalciferol (VITAMIN D) 1000 units tablet, Take 1,000 Units by mouth daily., Disp: , Rfl:  .  doxycycline (VIBRA-TABS) 100 MG tablet, Take 100 mg by mouth 2 (two) times daily., Disp: , Rfl:  .  fluconazole (DIFLUCAN) 100 MG tablet, fluconazole 100 mg tablet  TAKE ONE TABLET BY MOUTH EVERY DAY FOR SEVEN DAYS, Disp: , Rfl:  .  hydrochlorothiazide (HYDRODIURIL) 25 MG tablet, Take 1 tablet (25 mg total) by mouth daily., Disp: 90 tablet, Rfl: 3 .  hydrochlorothiazide (HYDRODIURIL) 25 MG tablet, TAKE (1) TABLET BY MOUTH ONCE DAILY., Disp: 90 tablet, Rfl: 3 .  ibuprofen (ADVIL,MOTRIN) 800 MG tablet, TAKE (1) TABLET BY MOUTH THREE TIMES DAILY AS NEEDED., Disp: 90 tablet, Rfl: 1 .  levothyroxine (SYNTHROID, LEVOTHROID) 200 MCG tablet, Take 1 tablet (200 mcg total) by mouth daily before breakfast., Disp: 90 tablet, Rfl: 3 .  Oxycodone HCl 10 MG TABS, , Disp: , Rfl:  .  triamcinolone cream (KENALOG) 0.1 %, triamcinolone acetonide 0.1 % topical cream  APPLY SMALL AMOUNT TO TO RASH ON ON THE LEG UP TO THREE TIMES DAILY FOR ITCHING & redness, Disp: , Rfl:  .  XTAMPZA ER 36 MG C12A, , Disp: , Rfl:   Social History  Tobacco Use  Smoking Status Never Smoker  Smokeless Tobacco Never Used    Allergies  Allergen Reactions  . Penicillins Anaphylaxis    Childhood allergy-unknown  . Sulfa Antibiotics Swelling    angioedema  . Sulfur Dioxide   . Demerol [Meperidine] Other (See Comments)    anxiety  . Morphine And Related Itching    side effect    Objective:  There were no vitals filed for this visit. There is no height or weight on file to calculate BMI. Constitutional Well developed. Well nourished.  Vascular Dorsalis pedis pulses palpable bilaterally. Posterior tibial pulses palpable bilaterally. Capillary refill normal to all digits.  No cyanosis or clubbing noted. Pedal hair growth normal.  Neurologic Normal speech. Oriented to person,  place, and time. Epicritic sensation to light touch grossly present bilaterally.  Dermatologic Nails well groomed and normal in appearance. No open wounds. No hyperkeratotic skin lotion noted on the plantar aspect of the foot.  Orthopedic:  Right second third fourth and fifth digit semireducible hammertoe toes noted.  There is lateral deviation of the toes and the metatarsophalangeal joint.  There is restricted motion at the metatarsophalangeal joint likely due to soft tissue/capsule contractures.  Right second digit is subluxed on the metatarsal head consistent with predislocation syndrome.  There is pain on the plantar aspect of the second digit likely plantar plate attenuation/rupture.   Radiographs: 2 views of skeletally mature adult foot noted: There is multiple arthritic changes noted in the midfoot as well as the hindfoot.  Lateral deviation of the digits 2 through 5 noted on the metatarsal heads.  Metatarsal parabola intact.  Hammertoe contractures noted 2 through 5. Assessment:   1. Hammer toe of right foot   2. Pain in right foot   3. 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  Hammertoe contractures of 2 through 5 with possible metatarsophalangeal joint capsule contractures -I explained to the patient the etiology of hammertoes as well as various treatment options associated with it.  Given that patient has failed all conservative therapy, I believe patient will benefit from surgical correction of the hammertoes in order to improve alignment as well as reduction in pain.  Patient agrees with the plan and would like to surgically correct the hammertoe deformities 2 through 5. -Given the nature of the hammertoe contracture I believe patient will benefit from right foot digital arthroplasty of 2 through 5 with MPJ capsulotomies with possible plantar plate repair of right second digit.  Digits 2 through 5 will be  fixated with K wires to hold the correction/alignment. -I have discussed the postop protocol with the patient thoroughly.  Patient will be placed in a cam boot she can be weightbearing as tolerated the cam boot. -Cam boot was dispensed -Informed surgical risk consent was reviewed and read aloud to the patient.  I reviewed the films.  I have discussed my findings with the patient in great detail.  I have discussed all risks including but not limited to infection, stiffness, scarring, limp, disability, deformity, damage to blood vessels and nerves, numbness, poor healing, need for braces, arthritis, chronic pain, amputation, death.  All benefits and realistic expectations discussed in great detail.  I have made no promises as to the outcome.  I have provided realistic expectations.  I have offered the patient a 2nd opinion, which they have declined and assured me they preferred to proceed despite the risks  No follow-ups on file.

## 2019-07-24 ENCOUNTER — Telehealth: Payer: Self-pay | Admitting: *Deleted

## 2019-07-24 NOTE — Telephone Encounter (Signed)
"  I can't remember the date of my surgery.  It's either February 15 or February 22.  I'm not sure.  Can you tell me what I need to know?  I'm sure it can't be the 15th because that is President's Day.  Everyone will probably be closed."  You are scheduled for surgery on February 15.  The surgical center as well as our office will be open on that day.  "Can you tell me what I need to know?"  I cannot give you an arrival time.  Someone from the surgical center will call you the Friday before and will give you your arrival time.   "That late, I need to let my ride know."  You are welcome to call the surgical center to see what they can tell you. The surgical center's phone number is [phone].  They do not have your information at this time, so please do not call today.  "I understand."

## 2019-07-26 DIAGNOSIS — Z1329 Encounter for screening for other suspected endocrine disorder: Secondary | ICD-10-CM | POA: Diagnosis not present

## 2019-07-26 DIAGNOSIS — E039 Hypothyroidism, unspecified: Secondary | ICD-10-CM | POA: Diagnosis not present

## 2019-07-26 DIAGNOSIS — I10 Essential (primary) hypertension: Secondary | ICD-10-CM | POA: Diagnosis not present

## 2019-07-26 DIAGNOSIS — E782 Mixed hyperlipidemia: Secondary | ICD-10-CM | POA: Diagnosis not present

## 2019-07-26 DIAGNOSIS — R7301 Impaired fasting glucose: Secondary | ICD-10-CM | POA: Diagnosis not present

## 2019-07-31 DIAGNOSIS — L84 Corns and callosities: Secondary | ICD-10-CM | POA: Diagnosis not present

## 2019-07-31 DIAGNOSIS — G4762 Sleep related leg cramps: Secondary | ICD-10-CM | POA: Diagnosis not present

## 2019-07-31 DIAGNOSIS — G894 Chronic pain syndrome: Secondary | ICD-10-CM | POA: Diagnosis not present

## 2019-07-31 DIAGNOSIS — E039 Hypothyroidism, unspecified: Secondary | ICD-10-CM | POA: Diagnosis not present

## 2019-07-31 DIAGNOSIS — R7303 Prediabetes: Secondary | ICD-10-CM | POA: Diagnosis not present

## 2019-08-02 DIAGNOSIS — M2041 Other hammer toe(s) (acquired), right foot: Secondary | ICD-10-CM | POA: Diagnosis not present

## 2019-08-03 ENCOUNTER — Telehealth: Payer: Self-pay | Admitting: Podiatry

## 2019-08-03 NOTE — Telephone Encounter (Signed)
DOS: 08/14/2019  SURGICAL PROCEDURES: Hammertoe Repair 2-5 HIDUP([id number]), Capsulotomy, MPJ Release Joint 2-5 [phone]), and Plantar Plate Repair 2nd Digit [phone]).  [facility] Medicare Effective 06/30/2019  Deductible is $0. Out of Pocket is $4,500 with $0 met and $4,500 remaining. CoInsurance is 100% / 0%.  Notification or Prior Authorization is not required for the requested services  This [employer] Medicare Advantage members plan does not currently require a prior authorization for these services. If you have general questions about the prior authorization requirements, please call us at [phone] or visit [email or web address] > Clinician Resources > Advance and Admission Notification Requirements. The number above acknowledges your notification. Please write this number down for future reference. Notification is not a guarantee of coverage or payment.  Decision ID #:[id number]  The number above acknowledges your inquiry and our response. Please write this number down and refer to it for future inquiries. Coverage and payment for an item or service is governed by the member's benefit plan document, and, if applicable, the provider's participation agreement with the Health Plan.

## 2019-08-03 NOTE — Telephone Encounter (Signed)
I have sx scheduled with Dr. Posey Pronto on 02/15 and I want to cancel that please. Please call me back. Thank you.

## 2019-08-03 NOTE — Telephone Encounter (Signed)
Called pt back about message she left wanting to cxl her sx for 02/15. Pt stated she wants to cancel sx because she doesn't know enough about it and from what she has read, she could be in a boot for 3-6 months which would put her in a world of hurt as she lives alone and has no one to drive her. Pt requested to come in for an appointment in March to discuss other treatment options as she is not "gung ho" about sx. I scheduled the pt to come in at 3:45 pm on Friday, 03/05.   I have cancelled the pt's sx on Dr. Serita Grit schedule in both Epic and the computer. I have also cancelled her postop visits and I notified Renee at [facility] of the cancellation as well.

## 2019-08-23 ENCOUNTER — Encounter: Payer: Medicare Other | Admitting: Podiatry

## 2019-08-29 DIAGNOSIS — E782 Mixed hyperlipidemia: Secondary | ICD-10-CM | POA: Diagnosis not present

## 2019-08-29 DIAGNOSIS — I10 Essential (primary) hypertension: Secondary | ICD-10-CM | POA: Diagnosis not present

## 2019-08-29 DIAGNOSIS — E7849 Other hyperlipidemia: Secondary | ICD-10-CM | POA: Diagnosis not present

## 2019-08-30 ENCOUNTER — Encounter: Payer: Medicare Other | Admitting: Podiatry

## 2019-09-01 ENCOUNTER — Ambulatory Visit: Payer: Medicare Other | Admitting: Podiatry

## 2019-09-13 ENCOUNTER — Encounter: Payer: Medicare Other | Admitting: Podiatry

## 2019-09-29 DIAGNOSIS — E039 Hypothyroidism, unspecified: Secondary | ICD-10-CM | POA: Diagnosis not present

## 2019-09-29 DIAGNOSIS — E7849 Other hyperlipidemia: Secondary | ICD-10-CM | POA: Diagnosis not present

## 2019-09-29 DIAGNOSIS — E782 Mixed hyperlipidemia: Secondary | ICD-10-CM | POA: Diagnosis not present

## 2019-09-29 DIAGNOSIS — I10 Essential (primary) hypertension: Secondary | ICD-10-CM | POA: Diagnosis not present

## 2019-09-29 DIAGNOSIS — R600 Localized edema: Secondary | ICD-10-CM | POA: Diagnosis not present

## 2019-11-21 DIAGNOSIS — R600 Localized edema: Secondary | ICD-10-CM | POA: Diagnosis not present

## 2019-11-21 DIAGNOSIS — I10 Essential (primary) hypertension: Secondary | ICD-10-CM | POA: Diagnosis not present

## 2019-11-21 DIAGNOSIS — E039 Hypothyroidism, unspecified: Secondary | ICD-10-CM | POA: Diagnosis not present

## 2019-11-21 DIAGNOSIS — E782 Mixed hyperlipidemia: Secondary | ICD-10-CM | POA: Diagnosis not present

## 2019-11-21 DIAGNOSIS — E7849 Other hyperlipidemia: Secondary | ICD-10-CM | POA: Diagnosis not present

## 2019-12-26 DIAGNOSIS — E782 Mixed hyperlipidemia: Secondary | ICD-10-CM | POA: Diagnosis not present

## 2019-12-26 DIAGNOSIS — E039 Hypothyroidism, unspecified: Secondary | ICD-10-CM | POA: Diagnosis not present

## 2019-12-26 DIAGNOSIS — E7849 Other hyperlipidemia: Secondary | ICD-10-CM | POA: Diagnosis not present

## 2019-12-26 DIAGNOSIS — I10 Essential (primary) hypertension: Secondary | ICD-10-CM | POA: Diagnosis not present

## 2019-12-26 DIAGNOSIS — R600 Localized edema: Secondary | ICD-10-CM | POA: Diagnosis not present

## 2020-01-02 DIAGNOSIS — I10 Essential (primary) hypertension: Secondary | ICD-10-CM | POA: Diagnosis not present

## 2020-01-02 DIAGNOSIS — E7849 Other hyperlipidemia: Secondary | ICD-10-CM | POA: Diagnosis not present

## 2020-01-02 DIAGNOSIS — E782 Mixed hyperlipidemia: Secondary | ICD-10-CM | POA: Diagnosis not present

## 2020-01-02 DIAGNOSIS — E039 Hypothyroidism, unspecified: Secondary | ICD-10-CM | POA: Diagnosis not present

## 2020-01-31 DIAGNOSIS — E559 Vitamin D deficiency, unspecified: Secondary | ICD-10-CM | POA: Diagnosis not present

## 2020-01-31 DIAGNOSIS — E782 Mixed hyperlipidemia: Secondary | ICD-10-CM | POA: Diagnosis not present

## 2020-01-31 DIAGNOSIS — R7303 Prediabetes: Secondary | ICD-10-CM | POA: Diagnosis not present

## 2020-01-31 DIAGNOSIS — I10 Essential (primary) hypertension: Secondary | ICD-10-CM | POA: Diagnosis not present

## 2020-01-31 DIAGNOSIS — E039 Hypothyroidism, unspecified: Secondary | ICD-10-CM | POA: Diagnosis not present

## 2020-02-05 DIAGNOSIS — E039 Hypothyroidism, unspecified: Secondary | ICD-10-CM | POA: Diagnosis not present

## 2020-02-05 DIAGNOSIS — R7303 Prediabetes: Secondary | ICD-10-CM | POA: Diagnosis not present

## 2020-02-05 DIAGNOSIS — L84 Corns and callosities: Secondary | ICD-10-CM | POA: Diagnosis not present

## 2020-02-05 DIAGNOSIS — G894 Chronic pain syndrome: Secondary | ICD-10-CM | POA: Diagnosis not present

## 2020-02-05 DIAGNOSIS — Z0001 Encounter for general adult medical examination with abnormal findings: Secondary | ICD-10-CM | POA: Diagnosis not present

## 2020-02-05 DIAGNOSIS — G4762 Sleep related leg cramps: Secondary | ICD-10-CM | POA: Diagnosis not present

## 2020-02-14 DIAGNOSIS — I10 Essential (primary) hypertension: Secondary | ICD-10-CM | POA: Diagnosis not present

## 2020-02-14 DIAGNOSIS — Z853 Personal history of malignant neoplasm of breast: Secondary | ICD-10-CM | POA: Diagnosis not present

## 2020-02-14 DIAGNOSIS — R7303 Prediabetes: Secondary | ICD-10-CM | POA: Diagnosis not present

## 2020-02-14 DIAGNOSIS — E039 Hypothyroidism, unspecified: Secondary | ICD-10-CM | POA: Diagnosis not present

## 2020-02-14 DIAGNOSIS — E559 Vitamin D deficiency, unspecified: Secondary | ICD-10-CM | POA: Diagnosis not present

## 2020-02-21 DIAGNOSIS — R2242 Localized swelling, mass and lump, left lower limb: Secondary | ICD-10-CM | POA: Diagnosis not present

## 2020-02-22 ENCOUNTER — Other Ambulatory Visit: Payer: Self-pay | Admitting: Internal Medicine

## 2020-02-22 DIAGNOSIS — R2242 Localized swelling, mass and lump, left lower limb: Secondary | ICD-10-CM

## 2020-02-28 ENCOUNTER — Encounter (HOSPITAL_COMMUNITY): Payer: Self-pay

## 2020-02-28 ENCOUNTER — Ambulatory Visit (HOSPITAL_COMMUNITY): Admission: RE | Admit: 2020-02-28 | Payer: Medicare Other | Source: Ambulatory Visit

## 2020-03-05 DIAGNOSIS — I10 Essential (primary) hypertension: Secondary | ICD-10-CM | POA: Diagnosis not present

## 2020-03-05 DIAGNOSIS — G8929 Other chronic pain: Secondary | ICD-10-CM | POA: Diagnosis not present

## 2020-03-05 DIAGNOSIS — H00016 Hordeolum externum left eye, unspecified eyelid: Secondary | ICD-10-CM | POA: Diagnosis not present

## 2020-03-05 DIAGNOSIS — E039 Hypothyroidism, unspecified: Secondary | ICD-10-CM | POA: Diagnosis not present

## 2020-03-05 DIAGNOSIS — G894 Chronic pain syndrome: Secondary | ICD-10-CM | POA: Diagnosis not present

## 2020-03-05 DIAGNOSIS — G4762 Sleep related leg cramps: Secondary | ICD-10-CM | POA: Diagnosis not present

## 2020-03-05 DIAGNOSIS — R7303 Prediabetes: Secondary | ICD-10-CM | POA: Diagnosis not present

## 2020-03-05 DIAGNOSIS — E559 Vitamin D deficiency, unspecified: Secondary | ICD-10-CM | POA: Diagnosis not present

## 2020-03-05 DIAGNOSIS — L84 Corns and callosities: Secondary | ICD-10-CM | POA: Diagnosis not present

## 2020-03-11 ENCOUNTER — Ambulatory Visit (HOSPITAL_COMMUNITY)
Admission: RE | Admit: 2020-03-11 | Discharge: 2020-03-11 | Disposition: A | Discharge status: Home / Self Care | Payer: Medicare Other | Source: Ambulatory Visit | Attending: Internal Medicine | Admitting: Internal Medicine

## 2020-03-11 ENCOUNTER — Other Ambulatory Visit: Payer: Self-pay

## 2020-03-11 DIAGNOSIS — R2242 Localized swelling, mass and lump, left lower limb: Secondary | ICD-10-CM | POA: Diagnosis not present

## 2020-03-11 DIAGNOSIS — M79605 Pain in left leg: Secondary | ICD-10-CM | POA: Diagnosis not present

## 2020-03-11 DIAGNOSIS — R600 Localized edema: Secondary | ICD-10-CM | POA: Diagnosis not present

## 2020-03-12 DIAGNOSIS — G894 Chronic pain syndrome: Secondary | ICD-10-CM | POA: Diagnosis not present

## 2020-03-12 DIAGNOSIS — E7849 Other hyperlipidemia: Secondary | ICD-10-CM | POA: Diagnosis not present

## 2020-03-12 DIAGNOSIS — E039 Hypothyroidism, unspecified: Secondary | ICD-10-CM | POA: Diagnosis not present

## 2020-03-12 DIAGNOSIS — I10 Essential (primary) hypertension: Secondary | ICD-10-CM | POA: Diagnosis not present

## 2020-03-20 ENCOUNTER — Other Ambulatory Visit: Payer: Self-pay

## 2020-03-20 DIAGNOSIS — I890 Lymphedema, not elsewhere classified: Secondary | ICD-10-CM

## 2020-03-28 DIAGNOSIS — M79672 Pain in left foot: Secondary | ICD-10-CM | POA: Diagnosis not present

## 2020-04-03 ENCOUNTER — Ambulatory Visit: Payer: Medicare Other | Admitting: Vascular Surgery

## 2020-04-03 ENCOUNTER — Other Ambulatory Visit: Payer: Self-pay

## 2020-04-03 ENCOUNTER — Ambulatory Visit (HOSPITAL_COMMUNITY)
Admission: RE | Admit: 2020-04-03 | Discharge: 2020-04-03 | Disposition: A | Discharge status: Home / Self Care | Payer: Medicare Other | Source: Ambulatory Visit | Attending: Vascular Surgery | Admitting: Vascular Surgery

## 2020-04-03 ENCOUNTER — Encounter: Payer: Self-pay | Admitting: Vascular Surgery

## 2020-04-03 VITALS — BP 128/72 | HR 52 | Temp 97.7°F | Resp 16 | Ht 62.0 in | Wt 209.0 lb

## 2020-04-03 DIAGNOSIS — R609 Edema, unspecified: Secondary | ICD-10-CM | POA: Diagnosis not present

## 2020-04-03 DIAGNOSIS — I890 Lymphedema, not elsewhere classified: Secondary | ICD-10-CM | POA: Insufficient documentation

## 2020-04-03 NOTE — Progress Notes (Signed)
Referring Physician: Dr. Wende Neighbors  Patient name: Teresa Anderson MRN: [id number] DOB: 07-23-1940 Sex: female  REASON FOR CONSULT: Bilateral leg pain  HPI: Teresa Anderson is a 79 y.o. female, referred for bilateral leg pain thought to be secondary to varicose veins.  She was previously evaluated by my partner Dr. Kellie Simmering in 2017 for similar symptoms.  She has had chronic leg pain for several years.  Left leg sometimes has pain that extends from the hip all the way to the foot.  She states that the pain can be random in nature sometimes occurring in the morning sometimes in the evening but no real association with standing walking or sitting.  She has no prior history of DVT.  Previous duplex ultrasound reviewed by Dr. Kellie Simmering in 2017 showed reflux in the thigh portion of the greater saphenous vein but no significant reflux distally and vein diameter of about 4 to 5 mm.  Other medical problems include obesity, hyperlipidemia, hypertension.  Past Medical History:  Diagnosis Date  . Allergy   . Arthritis   . Asthma   . Blood transfusion without reported diagnosis    with cancer surgery  . Cancer ([place])    breast and skin  . Diabetes mellitus without complication ([place])   . Hyperlipidemia   . Hypertension   . Lymphedema of leg   . Thyroid disease   . Varicose veins of bilateral lower extremities with other complications    Past Surgical History:  Procedure Laterality Date  . ABDOMINAL HYSTERECTOMY    . BREAST SURGERY     bilat  . CESAREAN SECTION     x4  . CHOLECYSTECTOMY    . COSMETIC SURGERY     reconstruction  . MASTECTOMY     bilateral    Family History  Problem Relation Age of Onset  . Heart disease Mother 79       heart attack  . COPD Father        former smoker  . Pancreatic cancer Father   . Celiac disease Daughter   . Cancer Maternal Aunt        lung-smoker    SOCIAL HISTORY: Social History   Socioeconomic History  . Marital status: Divorced    Spouse  name: Not on file  . Number of children: 5  . Years of education: 22  . Highest education level: Not on file  Occupational History  . Occupation: Statistician    Comment: retired  . Occupation: pre school  Tobacco Use  . Smoking status: Never Smoker  . Smokeless tobacco: Never Used  Vaping Use  . Vaping Use: Never used  Substance and Sexual Activity  . Alcohol use: No  . Drug use: No  . Sexual activity: Not Currently  Other Topics Concern  . Not on file  Social History Narrative  . Not on file   Social Determinants of Health   Financial Resource Strain:   . Difficulty of Paying Living Expenses: Not on file  Food Insecurity:   . Worried About Charity fundraiser in the Last Year: Not on file  . Ran Out of Food in the Last Year: Not on file  Transportation Needs:   . Lack of Transportation (Medical): Not on file  . Lack of Transportation (Non-Medical): Not on file  Physical Activity:   . Days of Exercise per Week: Not on file  . Minutes of Exercise per Session: Not on file  Stress:   . Feeling of  Stress : Not on file  Social Connections:   . Frequency of Communication with Friends and Family: Not on file  . Frequency of Social Gatherings with Friends and Family: Not on file  . Attends Religious Services: Not on file  . Active Member of Clubs or Organizations: Not on file  . Attends Archivist Meetings: Not on file  . Marital Status: Not on file  Intimate Partner Violence:   . Fear of Current or Ex-Partner: Not on file  . Emotionally Abused: Not on file  . Physically Abused: Not on file  . Sexually Abused: Not on file    Allergies  Allergen Reactions  . Penicillins Anaphylaxis    Childhood allergy-unknown  . Sulfa Antibiotics Swelling    angioedema  . Sulfur Dioxide   . Demerol [Meperidine] Other (See Comments)    anxiety  . Morphine And Related Itching    side effect     Current Outpatient Medications  Medication Sig Dispense Refill  .  atenolol (TENORMIN) 50 MG tablet TAKE (1) TABLET BY MOUTH ONCE DAILY. 90 tablet 0  . cholecalciferol (VITAMIN D) 1000 units tablet Take 1,000 Units by mouth daily.    . fluconazole (DIFLUCAN) 100 MG tablet fluconazole 100 mg tablet  TAKE ONE TABLET BY MOUTH EVERY DAY FOR SEVEN DAYS    . hydrochlorothiazide (HYDRODIURIL) 25 MG tablet Take 1 tablet (25 mg total) by mouth daily. 90 tablet 3  . hydrochlorothiazide (HYDRODIURIL) 25 MG tablet TAKE (1) TABLET BY MOUTH ONCE DAILY. 90 tablet 3  . ibuprofen (ADVIL,MOTRIN) 800 MG tablet TAKE (1) TABLET BY MOUTH THREE TIMES DAILY AS NEEDED. 90 tablet 1  . levothyroxine (SYNTHROID, LEVOTHROID) 200 MCG tablet Take 1 tablet (200 mcg total) by mouth daily before breakfast. 90 tablet 3  . Oxycodone HCl 10 MG TABS     . XTAMPZA ER 36 MG C12A     . atorvastatin (LIPITOR) 20 MG tablet atorvastatin 20 mg tablet  TAKE 1 TABLET BY MOUTH ONCE DAILY AT BEDTIME (Patient not taking: Reported on 04/03/2020)    . doxycycline (VIBRA-TABS) 100 MG tablet Take 100 mg by mouth 2 (two) times daily. (Patient not taking: Reported on 04/03/2020)    . triamcinolone cream (KENALOG) 0.1 % triamcinolone acetonide 0.1 % topical cream  APPLY SMALL AMOUNT TO TO RASH ON ON THE LEG UP TO THREE TIMES DAILY FOR ITCHING & redness (Patient not taking: Reported on 04/03/2020)     No current facility-administered medications for this visit.    ROS:   General:  No weight loss, Fever, chills  HEENT: No recent headaches, no nasal bleeding, no visual changes, no sore throat  Neurologic: No dizziness, blackouts, seizures. No recent symptoms of stroke or mini- stroke. No recent episodes of slurred speech, or temporary blindness.  Cardiac: No recent episodes of chest pain/pressure, no shortness of breath at rest.  No shortness of breath with exertion.  Denies history of atrial fibrillation or irregular heartbeat  Vascular: No history of rest pain in feet.  No history of claudication.  No history of  non-healing ulcer, No history of DVT   Pulmonary: No home oxygen, no productive cough, no hemoptysis,  No asthma or wheezing  Musculoskeletal:  [X]  Arthritis, [X]  Low back pain,  [X]  Joint pain  Hematologic:No history of hypercoagulable state.  No history of easy bleeding.  No history of anemia  Gastrointestinal: No hematochezia or melena,  No gastroesophageal reflux, no trouble swallowing  Urinary: [ ]  chronic Kidney disease, [ ]   on HD - [ ]  MWF or [ ]  TTHS, [ ]  Burning with urination, [ ]  Frequent urination, [ ]  Difficulty urinating;   Skin: No rashes  Psychological: No history of anxiety,  No history of depression   Physical Examination  Vitals:   04/03/20 1430  BP: 128/72  Pulse: (!) 52  Resp: 16  Temp: 97.7 F (36.5 C)  TempSrc: Temporal  SpO2: 98%  Weight: 209 lb (94.8 kg)  Height: 5\' 2"  (1.575 m)    Body mass index is 38.23 kg/m.  General:  Alert and oriented, no acute distress HEENT: Normal Neck: No JVD Cardiac: Regular Rate and Rhythm Skin: No rash Extremity Pulses:  2+ radial, brachial, femoral, absent dorsalis pedis, 2+ posterior tibial pulses bilaterally Musculoskeletal: No deformity or edema, patient's body habitus has large deposits of adipose tissue in the medial calf laterally and multiple areas of the entire right and left thigh. Neurologic: Upper and lower extremity motor 5/5 and symmetric  DATA:  Patient had a venous reflux exam today which showed reflux in the deep system bilaterally as well as in the greater saphenous bilaterally.  Greater saphenous vein was dilated in the high thigh to 1 cm.  However it is fairly normal in diameter from the mid thigh down.  ASSESSMENT: Patient with lower extremity pain probably multifactorial in nature.  Some of this is due to obesity.  I do not believe the deposits in her legs are lymphedema.  These appear to be more lip edema deposits from obesity.  Her pain syndrome does not really correlate with the usual pain  we see with varicose veins which is usually heaviness fullness aching that progresses as the day goes along.  Her pain is very random in nature and involves all parts of her body.  She does have some reflux in the deep and superficial venous system.  Unfortunately she is not a very good candidate for compression because of the body habitus of her legs.  She could potentially use Ace wraps with some relief.  However, I think even if we treat all of her vein problems she is still going to have lower extremity pain as this is multifactorial.  She is not really a candidate for laser ablation as she only has a short segment of her saphenous vein that is dilated and I do not believe we would make significant progress in improving her pain symptoms by treatment of this short segment of saphenous vein.   PLAN: All of these findings and treatment plan were discussed at length with patient today.  Understandably she is frustrated with all of her chronic pain symptoms.  Unfortunately there is not really anything from a vascular standpoint we would be able to provide that I think with a good significant difference in her quality of life overall.  She will follow up on an as-needed basis.   Ruta Hinds, MD [facility] Office: [phone]

## 2020-04-08 DIAGNOSIS — G4762 Sleep related leg cramps: Secondary | ICD-10-CM | POA: Diagnosis not present

## 2020-04-08 DIAGNOSIS — L84 Corns and callosities: Secondary | ICD-10-CM | POA: Diagnosis not present

## 2020-04-08 DIAGNOSIS — E039 Hypothyroidism, unspecified: Secondary | ICD-10-CM | POA: Diagnosis not present

## 2020-04-08 DIAGNOSIS — R7303 Prediabetes: Secondary | ICD-10-CM | POA: Diagnosis not present

## 2020-04-08 DIAGNOSIS — G894 Chronic pain syndrome: Secondary | ICD-10-CM | POA: Diagnosis not present

## 2020-04-12 DIAGNOSIS — E7849 Other hyperlipidemia: Secondary | ICD-10-CM | POA: Diagnosis not present

## 2020-04-12 DIAGNOSIS — I10 Essential (primary) hypertension: Secondary | ICD-10-CM | POA: Diagnosis not present

## 2020-04-12 DIAGNOSIS — M1990 Unspecified osteoarthritis, unspecified site: Secondary | ICD-10-CM | POA: Diagnosis not present

## 2020-04-12 DIAGNOSIS — R7303 Prediabetes: Secondary | ICD-10-CM | POA: Diagnosis not present

## 2020-04-12 DIAGNOSIS — E039 Hypothyroidism, unspecified: Secondary | ICD-10-CM | POA: Diagnosis not present

## 2020-04-27 DIAGNOSIS — G4762 Sleep related leg cramps: Secondary | ICD-10-CM | POA: Diagnosis not present

## 2020-04-27 DIAGNOSIS — L84 Corns and callosities: Secondary | ICD-10-CM | POA: Diagnosis not present

## 2020-04-27 DIAGNOSIS — R7303 Prediabetes: Secondary | ICD-10-CM | POA: Diagnosis not present

## 2020-04-27 DIAGNOSIS — G894 Chronic pain syndrome: Secondary | ICD-10-CM | POA: Diagnosis not present

## 2020-04-27 DIAGNOSIS — E039 Hypothyroidism, unspecified: Secondary | ICD-10-CM | POA: Diagnosis not present

## 2020-05-08 DIAGNOSIS — H00022 Hordeolum internum right lower eyelid: Secondary | ICD-10-CM | POA: Diagnosis not present

## 2020-05-20 DIAGNOSIS — R7303 Prediabetes: Secondary | ICD-10-CM | POA: Diagnosis not present

## 2020-05-20 DIAGNOSIS — I10 Essential (primary) hypertension: Secondary | ICD-10-CM | POA: Diagnosis not present

## 2020-05-20 DIAGNOSIS — E039 Hypothyroidism, unspecified: Secondary | ICD-10-CM | POA: Diagnosis not present

## 2020-05-20 DIAGNOSIS — E7849 Other hyperlipidemia: Secondary | ICD-10-CM | POA: Diagnosis not present

## 2020-05-20 DIAGNOSIS — E559 Vitamin D deficiency, unspecified: Secondary | ICD-10-CM | POA: Diagnosis not present

## 2020-05-22 DIAGNOSIS — J0100 Acute maxillary sinusitis, unspecified: Secondary | ICD-10-CM | POA: Diagnosis not present

## 2020-05-22 DIAGNOSIS — R051 Acute cough: Secondary | ICD-10-CM | POA: Diagnosis not present

## 2020-06-18 DIAGNOSIS — R21 Rash and other nonspecific skin eruption: Secondary | ICD-10-CM | POA: Diagnosis not present

## 2020-06-18 DIAGNOSIS — L299 Pruritus, unspecified: Secondary | ICD-10-CM | POA: Diagnosis not present

## 2020-06-28 DIAGNOSIS — M797 Fibromyalgia: Secondary | ICD-10-CM | POA: Diagnosis not present

## 2020-06-28 DIAGNOSIS — E039 Hypothyroidism, unspecified: Secondary | ICD-10-CM | POA: Diagnosis not present

## 2020-07-17 DIAGNOSIS — Z853 Personal history of malignant neoplasm of breast: Secondary | ICD-10-CM | POA: Diagnosis not present

## 2020-07-17 DIAGNOSIS — E039 Hypothyroidism, unspecified: Secondary | ICD-10-CM | POA: Diagnosis not present

## 2020-07-17 DIAGNOSIS — M79606 Pain in leg, unspecified: Secondary | ICD-10-CM | POA: Diagnosis not present

## 2020-07-17 DIAGNOSIS — I890 Lymphedema, not elsewhere classified: Secondary | ICD-10-CM | POA: Diagnosis not present

## 2020-07-17 DIAGNOSIS — R944 Abnormal results of kidney function studies: Secondary | ICD-10-CM | POA: Diagnosis not present

## 2020-07-17 DIAGNOSIS — R7301 Impaired fasting glucose: Secondary | ICD-10-CM | POA: Diagnosis not present

## 2020-07-17 DIAGNOSIS — G894 Chronic pain syndrome: Secondary | ICD-10-CM | POA: Diagnosis not present

## 2020-07-17 DIAGNOSIS — R7303 Prediabetes: Secondary | ICD-10-CM | POA: Diagnosis not present

## 2020-07-17 DIAGNOSIS — G4762 Sleep related leg cramps: Secondary | ICD-10-CM | POA: Diagnosis not present

## 2020-07-17 DIAGNOSIS — L84 Corns and callosities: Secondary | ICD-10-CM | POA: Diagnosis not present

## 2020-07-27 DIAGNOSIS — E039 Hypothyroidism, unspecified: Secondary | ICD-10-CM | POA: Diagnosis not present

## 2020-07-27 DIAGNOSIS — M797 Fibromyalgia: Secondary | ICD-10-CM | POA: Diagnosis not present

## 2020-07-27 DIAGNOSIS — I10 Essential (primary) hypertension: Secondary | ICD-10-CM | POA: Diagnosis not present

## 2020-07-27 DIAGNOSIS — R300 Dysuria: Secondary | ICD-10-CM | POA: Diagnosis not present

## 2020-08-26 DIAGNOSIS — E559 Vitamin D deficiency, unspecified: Secondary | ICD-10-CM | POA: Diagnosis not present

## 2020-08-26 DIAGNOSIS — J45909 Unspecified asthma, uncomplicated: Secondary | ICD-10-CM | POA: Diagnosis not present

## 2020-08-26 DIAGNOSIS — R944 Abnormal results of kidney function studies: Secondary | ICD-10-CM | POA: Diagnosis not present

## 2020-08-26 DIAGNOSIS — E039 Hypothyroidism, unspecified: Secondary | ICD-10-CM | POA: Diagnosis not present

## 2020-08-26 DIAGNOSIS — I890 Lymphedema, not elsewhere classified: Secondary | ICD-10-CM | POA: Diagnosis not present

## 2020-09-06 DIAGNOSIS — J309 Allergic rhinitis, unspecified: Secondary | ICD-10-CM | POA: Diagnosis not present

## 2020-09-06 DIAGNOSIS — R040 Epistaxis: Secondary | ICD-10-CM | POA: Diagnosis not present

## 2020-09-06 DIAGNOSIS — M419 Scoliosis, unspecified: Secondary | ICD-10-CM | POA: Diagnosis not present

## 2020-09-25 DIAGNOSIS — E039 Hypothyroidism, unspecified: Secondary | ICD-10-CM | POA: Diagnosis not present

## 2020-09-25 DIAGNOSIS — M79606 Pain in leg, unspecified: Secondary | ICD-10-CM | POA: Diagnosis not present

## 2020-09-25 DIAGNOSIS — E559 Vitamin D deficiency, unspecified: Secondary | ICD-10-CM | POA: Diagnosis not present

## 2020-09-25 DIAGNOSIS — J45909 Unspecified asthma, uncomplicated: Secondary | ICD-10-CM | POA: Diagnosis not present

## 2020-09-25 DIAGNOSIS — E1165 Type 2 diabetes mellitus with hyperglycemia: Secondary | ICD-10-CM | POA: Diagnosis not present

## 2020-09-25 DIAGNOSIS — R944 Abnormal results of kidney function studies: Secondary | ICD-10-CM | POA: Diagnosis not present

## 2020-10-27 DIAGNOSIS — E1165 Type 2 diabetes mellitus with hyperglycemia: Secondary | ICD-10-CM | POA: Diagnosis not present

## 2020-10-27 DIAGNOSIS — M1990 Unspecified osteoarthritis, unspecified site: Secondary | ICD-10-CM | POA: Diagnosis not present

## 2020-12-11 DIAGNOSIS — M79604 Pain in right leg: Secondary | ICD-10-CM | POA: Diagnosis not present

## 2020-12-11 DIAGNOSIS — M79605 Pain in left leg: Secondary | ICD-10-CM | POA: Diagnosis not present

## 2020-12-11 DIAGNOSIS — R944 Abnormal results of kidney function studies: Secondary | ICD-10-CM | POA: Diagnosis not present

## 2020-12-11 DIAGNOSIS — E039 Hypothyroidism, unspecified: Secondary | ICD-10-CM | POA: Diagnosis not present

## 2020-12-11 DIAGNOSIS — Z853 Personal history of malignant neoplasm of breast: Secondary | ICD-10-CM | POA: Diagnosis not present

## 2020-12-11 DIAGNOSIS — G2581 Restless legs syndrome: Secondary | ICD-10-CM | POA: Diagnosis not present

## 2020-12-11 DIAGNOSIS — R7303 Prediabetes: Secondary | ICD-10-CM | POA: Diagnosis not present

## 2020-12-11 DIAGNOSIS — I872 Venous insufficiency (chronic) (peripheral): Secondary | ICD-10-CM | POA: Diagnosis not present

## 2020-12-11 DIAGNOSIS — E559 Vitamin D deficiency, unspecified: Secondary | ICD-10-CM | POA: Diagnosis not present

## 2020-12-11 DIAGNOSIS — G894 Chronic pain syndrome: Secondary | ICD-10-CM | POA: Diagnosis not present

## 2020-12-26 DIAGNOSIS — M1990 Unspecified osteoarthritis, unspecified site: Secondary | ICD-10-CM | POA: Diagnosis not present

## 2020-12-26 DIAGNOSIS — E1165 Type 2 diabetes mellitus with hyperglycemia: Secondary | ICD-10-CM | POA: Diagnosis not present

## 2020-12-27 DIAGNOSIS — E1165 Type 2 diabetes mellitus with hyperglycemia: Secondary | ICD-10-CM | POA: Diagnosis not present

## 2020-12-27 DIAGNOSIS — M1990 Unspecified osteoarthritis, unspecified site: Secondary | ICD-10-CM | POA: Diagnosis not present

## 2021-01-29 DIAGNOSIS — M25512 Pain in left shoulder: Secondary | ICD-10-CM | POA: Diagnosis not present

## 2021-01-29 DIAGNOSIS — G894 Chronic pain syndrome: Secondary | ICD-10-CM | POA: Diagnosis not present

## 2021-01-29 DIAGNOSIS — L918 Other hypertrophic disorders of the skin: Secondary | ICD-10-CM | POA: Diagnosis not present

## 2021-01-29 DIAGNOSIS — M25552 Pain in left hip: Secondary | ICD-10-CM | POA: Diagnosis not present

## 2021-01-29 DIAGNOSIS — Z0000 Encounter for general adult medical examination without abnormal findings: Secondary | ICD-10-CM | POA: Diagnosis not present

## 2021-01-29 DIAGNOSIS — Z0001 Encounter for general adult medical examination with abnormal findings: Secondary | ICD-10-CM | POA: Diagnosis not present

## 2021-02-19 DIAGNOSIS — M19012 Primary osteoarthritis, left shoulder: Secondary | ICD-10-CM | POA: Diagnosis not present

## 2021-02-26 DIAGNOSIS — M1990 Unspecified osteoarthritis, unspecified site: Secondary | ICD-10-CM | POA: Diagnosis not present

## 2021-02-26 DIAGNOSIS — E1165 Type 2 diabetes mellitus with hyperglycemia: Secondary | ICD-10-CM | POA: Diagnosis not present

## 2021-03-28 DIAGNOSIS — M1990 Unspecified osteoarthritis, unspecified site: Secondary | ICD-10-CM | POA: Diagnosis not present

## 2021-03-28 DIAGNOSIS — E1165 Type 2 diabetes mellitus with hyperglycemia: Secondary | ICD-10-CM | POA: Diagnosis not present

## 2021-04-28 DIAGNOSIS — E1165 Type 2 diabetes mellitus with hyperglycemia: Secondary | ICD-10-CM | POA: Diagnosis not present

## 2021-04-28 DIAGNOSIS — M1990 Unspecified osteoarthritis, unspecified site: Secondary | ICD-10-CM | POA: Diagnosis not present

## 2021-05-21 DIAGNOSIS — L299 Pruritus, unspecified: Secondary | ICD-10-CM | POA: Diagnosis not present

## 2021-05-28 DIAGNOSIS — E1165 Type 2 diabetes mellitus with hyperglycemia: Secondary | ICD-10-CM | POA: Diagnosis not present

## 2021-05-28 DIAGNOSIS — M1990 Unspecified osteoarthritis, unspecified site: Secondary | ICD-10-CM | POA: Diagnosis not present

## 2021-06-09 DIAGNOSIS — L309 Dermatitis, unspecified: Secondary | ICD-10-CM | POA: Diagnosis not present

## 2021-06-09 DIAGNOSIS — N644 Mastodynia: Secondary | ICD-10-CM | POA: Diagnosis not present

## 2021-06-09 DIAGNOSIS — M79604 Pain in right leg: Secondary | ICD-10-CM | POA: Diagnosis not present

## 2021-06-09 DIAGNOSIS — L299 Pruritus, unspecified: Secondary | ICD-10-CM | POA: Diagnosis not present

## 2021-06-09 DIAGNOSIS — M79605 Pain in left leg: Secondary | ICD-10-CM | POA: Diagnosis not present

## 2021-06-09 DIAGNOSIS — G894 Chronic pain syndrome: Secondary | ICD-10-CM | POA: Diagnosis not present

## 2021-06-09 DIAGNOSIS — R252 Cramp and spasm: Secondary | ICD-10-CM | POA: Diagnosis not present

## 2021-06-09 DIAGNOSIS — K5909 Other constipation: Secondary | ICD-10-CM | POA: Diagnosis not present

## 2021-06-17 ENCOUNTER — Other Ambulatory Visit (HOSPITAL_COMMUNITY): Payer: Self-pay | Admitting: Internal Medicine

## 2021-06-17 DIAGNOSIS — N644 Mastodynia: Secondary | ICD-10-CM

## 2021-06-27 DIAGNOSIS — M1990 Unspecified osteoarthritis, unspecified site: Secondary | ICD-10-CM | POA: Diagnosis not present

## 2021-06-27 DIAGNOSIS — E1165 Type 2 diabetes mellitus with hyperglycemia: Secondary | ICD-10-CM | POA: Diagnosis not present

## 2021-07-01 ENCOUNTER — Encounter (HOSPITAL_COMMUNITY): Payer: Self-pay

## 2021-07-01 ENCOUNTER — Ambulatory Visit (HOSPITAL_COMMUNITY): Admission: RE | Admit: 2021-07-01 | Payer: Medicare Other | Source: Ambulatory Visit

## 2021-07-14 DIAGNOSIS — M19012 Primary osteoarthritis, left shoulder: Secondary | ICD-10-CM | POA: Diagnosis not present

## 2021-07-29 DIAGNOSIS — E1165 Type 2 diabetes mellitus with hyperglycemia: Secondary | ICD-10-CM | POA: Diagnosis not present

## 2021-07-29 DIAGNOSIS — M1990 Unspecified osteoarthritis, unspecified site: Secondary | ICD-10-CM | POA: Diagnosis not present

## 2021-08-18 DIAGNOSIS — B372 Candidiasis of skin and nail: Secondary | ICD-10-CM | POA: Diagnosis not present

## 2021-10-16 DIAGNOSIS — M25512 Pain in left shoulder: Secondary | ICD-10-CM | POA: Diagnosis not present

## 2021-10-16 DIAGNOSIS — L299 Pruritus, unspecified: Secondary | ICD-10-CM | POA: Diagnosis not present

## 2021-10-16 DIAGNOSIS — G894 Chronic pain syndrome: Secondary | ICD-10-CM | POA: Diagnosis not present

## 2021-10-16 DIAGNOSIS — L989 Disorder of the skin and subcutaneous tissue, unspecified: Secondary | ICD-10-CM | POA: Diagnosis not present

## 2021-10-20 ENCOUNTER — Other Ambulatory Visit: Payer: Self-pay | Admitting: Orthopedic Surgery

## 2021-10-20 DIAGNOSIS — M19012 Primary osteoarthritis, left shoulder: Secondary | ICD-10-CM | POA: Diagnosis not present

## 2021-10-20 DIAGNOSIS — Z96619 Presence of unspecified artificial shoulder joint: Secondary | ICD-10-CM

## 2021-10-26 DIAGNOSIS — E039 Hypothyroidism, unspecified: Secondary | ICD-10-CM | POA: Diagnosis not present

## 2021-10-26 DIAGNOSIS — I10 Essential (primary) hypertension: Secondary | ICD-10-CM | POA: Diagnosis not present

## 2021-10-26 DIAGNOSIS — E119 Type 2 diabetes mellitus without complications: Secondary | ICD-10-CM | POA: Diagnosis not present

## 2021-11-12 ENCOUNTER — Other Ambulatory Visit: Payer: Medicare Other

## 2021-11-27 DIAGNOSIS — I10 Essential (primary) hypertension: Secondary | ICD-10-CM | POA: Diagnosis not present

## 2021-12-09 DIAGNOSIS — L299 Pruritus, unspecified: Secondary | ICD-10-CM | POA: Diagnosis not present

## 2021-12-09 DIAGNOSIS — R079 Chest pain, unspecified: Secondary | ICD-10-CM | POA: Diagnosis not present

## 2021-12-12 ENCOUNTER — Encounter: Payer: Self-pay | Admitting: Internal Medicine

## 2021-12-12 ENCOUNTER — Other Ambulatory Visit (HOSPITAL_COMMUNITY)
Admission: RE | Admit: 2021-12-12 | Discharge: 2021-12-12 | Disposition: A | Discharge status: Home / Self Care | Payer: Medicare Other | Source: Ambulatory Visit | Attending: Internal Medicine | Admitting: Internal Medicine

## 2021-12-12 ENCOUNTER — Ambulatory Visit: Payer: Medicare Other | Admitting: Internal Medicine

## 2021-12-12 VITALS — BP 132/70 | HR 62 | Ht 64.0 in | Wt 205.4 lb

## 2021-12-12 DIAGNOSIS — E785 Hyperlipidemia, unspecified: Secondary | ICD-10-CM | POA: Diagnosis not present

## 2021-12-12 DIAGNOSIS — R079 Chest pain, unspecified: Secondary | ICD-10-CM

## 2021-12-12 LAB — BASIC METABOLIC PANEL
Anion gap: 7 (ref 5–15)
BUN: 23 mg/dL (ref 8–23)
CO2: 28 mmol/L (ref 22–32)
Calcium: 8.7 mg/dL — ABNORMAL LOW (ref 8.9–10.3)
Chloride: 100 mmol/L (ref 98–111)
Creatinine, Ser: 0.75 mg/dL (ref 0.44–1.00)
GFR, Estimated: >60 mL/min (ref >60)
Glucose, Bld: 84 mg/dL (ref 70–99)
Potassium: 3.7 mmol/L (ref 3.5–5.1)
Sodium: 135 mmol/L (ref 135–145)

## 2021-12-12 LAB — CBC
HCT: 42.5 % (ref 36.0–46.0)
Hemoglobin: 14 g/dL (ref 12.0–15.0)
MCH: 31.4 pg (ref 26.0–34.0)
MCHC: 32.9 g/dL (ref 30.0–36.0)
MCV: 95.3 fL (ref 80.0–100.0)
Platelets: 169 10*3/uL (ref 150–400)
RBC: 4.46 MIL/uL (ref 3.87–5.11)
RDW: 12.5 % (ref 11.5–15.5)
WBC: 4.4 10*3/uL (ref 4.0–10.5)
nRBC: 0 % (ref 0.0–0.2)

## 2021-12-12 NOTE — Patient Instructions (Signed)
Medication Instructions:  Your physician recommends that you continue on your current medications as directed. Please refer to the Current Medication list given to you today.  *If you need a refill on your cardiac medications before your next appointment, please call your pharmacy*   Lab Work: Your physician recommends that you return for lab work in: Today   If you have labs (blood work) drawn today and your tests are completely normal, you will receive your results only by: MyChart Message (if you have MyChart) OR A paper copy in the mail If you have any lab test that is abnormal or we need to change your treatment, we will call you to review the results.   Testing/Procedures: Your physician has requested that you have an echocardiogram. Echocardiography is a painless test that uses sound waves to create images of your heart. It provides your doctor with information about the size and shape of your heart and how well your heart's chambers and valves are working. This procedure takes approximately one hour. There are no restrictions for this procedure.    Follow-Up: At [facility], you and your health needs are our priority.  As part of our continuing mission to provide you with exceptional heart care, we have created designated Provider Care Teams.  These Care Teams include your primary Cardiologist (physician) and Advanced Practice Providers (APPs -  Physician Assistants and Nurse Practitioners) who all work together to provide you with the care you need, when you need it.  We recommend signing up for the patient portal called "MyChart".  Sign up information is provided on this After Visit Summary.  MyChart is used to connect with patients for Virtual Visits (Telemedicine).  Patients are able to view lab/test results, encounter notes, upcoming appointments, etc.  Non-urgent messages can be sent to your provider as well.   To learn more about what you can do with MyChart, go to  [email or web address].    Your next appointment:    October  The format for your next appointment:   In Person  Provider:   Dorris Carnes, MD    Other Instructions Thank you for choosing [facility]!    Important Information About Sugar

## 2021-12-12 NOTE — Progress Notes (Signed)
Cardiology Office Note   Date:  12/12/2021   ID:  Teresa Anderson, DOB Nov 26, 1940, MRN [id number]  PCP:  Celene Squibb, MD  Cardiologist:   Dorris Carnes, MD    Patient referreed by Teresa Anderson forevaluation of CP    History of Present Illness: Teresa Anderson is a 81 y.o. female with a history of  HTN and hypothyoridism   Followed by Teresa Anderson   Referred for evaluation ofCP  The pt says that she has had R mid chest pain    Dull ache.  Comes and goes   Not positional or pleuritic   No trigger     Nagging     One time only it radiated to the back   Occurs daily      When not having spells feels OK   Stays busy.      Current Meds  Medication Sig   atenolol (TENORMIN) 50 MG tablet TAKE (1) TABLET BY MOUTH ONCE DAILY.   hydrochlorothiazide (HYDRODIURIL) 25 MG tablet TAKE (1) TABLET BY MOUTH ONCE DAILY.   levocetirizine (XYZAL) 5 MG tablet Take 5 mg by mouth at bedtime.   levothyroxine (SYNTHROID, LEVOTHROID) 200 MCG tablet Take 1 tablet (200 mcg total) by mouth daily before breakfast.   Oxycodone HCl 10 MG TABS      Allergies:   Penicillins, Sulfa antibiotics, Sulfur dioxide, Demerol [meperidine], and Morphine and related   Past Medical History:  Diagnosis Date   Allergy    Arthritis    Asthma    Blood transfusion without reported diagnosis    with cancer surgery   Cancer ([place])    breast and skin   Diabetes mellitus without complication ([place])    Hyperlipidemia    Hypertension    Lymphedema of leg    Thyroid disease    Varicose veins of bilateral lower extremities with other complications     Past Surgical History:  Procedure Laterality Date   ABDOMINAL HYSTERECTOMY     BREAST SURGERY     bilat   CESAREAN SECTION     x4   CHOLECYSTECTOMY     COSMETIC SURGERY     reconstruction   MASTECTOMY     bilateral     Social History:  The patient  reports that she has never smoked. She has never used smokeless tobacco. She reports that she does not drink alcohol and does not  use drugs.   Family History:  The patient's family history includes COPD in her father; Cancer in her maternal aunt; Celiac disease in her daughter; Heart disease (age of onset: 13) in her mother; Pancreatic cancer in her father.    ROS:  Please see the history of present illness. All other systems are reviewed and  Negative to the above problem except as noted.    PHYSICAL EXAM: VS:  BP 132/70   Pulse 62   Ht '5\' 4"'$  (1.626 m)   Wt 205 lb 6.4 oz (93.2 kg)   SpO2 96%   BMI 35.26 kg/m   GEN: Obese 81 yo in no acute distress  HEENT: normal  Neck: no JVD, carotid bruits Cardiac: RRR; no murmurs,edema  Respiratory:  clear to auscultation bilaterally, GI: Obese  nontender,  + BS  No definite hepatomegaly  MS: no deformity Moving all extremities   Skin: warm and dry, no rash Neuro:  Strength and sensation are intact Psych: euthymic mood, full affect   EKG:  EKG is ordered today.  NSR   62  First degree AV block   LAD      Lipid Panel    Component Value Date/Time   CHOL 219 (H) 12/14/2016 1153   TRIG 223 (H) 12/14/2016 1153   HDL 44 (L) 12/14/2016 1153   CHOLHDL 5.0 (H) 12/14/2016 1153   VLDL 45 (H) 12/14/2016 1153   LDLCALC 130 (H) 12/14/2016 1153      Wt Readings from Last 3 Encounters:  12/12/21 205 lb 6.4 oz (93.2 kg)  04/03/20 209 lb (94.8 kg)  02/08/18 220 lb 3.2 oz (99.9 kg)      ASSESSMENT AND PLAN:  1  Chest pain   Atypical   I do not think cardiac    Would get CXR to r/o lesion.    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  The patient does not have concerns regarding medicines.  Signed, Dorris Carnes, MD  12/12/2021 9:28 AM    [address], [place], [place]   [address] Phone: [phone]; Fax: [phone]

## 2021-12-13 LAB — LIPOPROTEIN A (LPA): Lipoprotein (a): 24.8 nmol/L (ref <75.0)

## 2021-12-13 LAB — NMR, LIPOPROFILE
Cholesterol, Total: 196 mg/dL (ref 100–199)
HDL Cholesterol by NMR: 52 mg/dL (ref >39)
HDL Particle Number: 31.5 umol/L (ref >=30.5)
LDL Particle Number: 1491 nmol/L — ABNORMAL HIGH (ref <1000)
LDL Size: 20.5 nm — ABNORMAL LOW (ref >20.5)
LDL-C (NIH Calc): 117 mg/dL — ABNORMAL HIGH (ref 0–99)
LP-IR Score: 45 (ref <=45)
Small LDL Particle Number: 653 nmol/L — ABNORMAL HIGH (ref <=527)
Triglycerides by NMR: 156 mg/dL — ABNORMAL HIGH (ref 0–149)

## 2021-12-14 LAB — MISC LABCORP TEST (SEND OUT): Labcorp test code: 167015

## 2021-12-19 ENCOUNTER — Ambulatory Visit (HOSPITAL_COMMUNITY)
Admission: RE | Admit: 2021-12-19 | Discharge: 2021-12-19 | Disposition: A | Discharge status: Home / Self Care | Payer: Medicare Other | Source: Ambulatory Visit | Attending: Internal Medicine | Admitting: Internal Medicine

## 2021-12-19 DIAGNOSIS — R079 Chest pain, unspecified: Secondary | ICD-10-CM

## 2021-12-19 LAB — ECHOCARDIOGRAM COMPLETE
AR max vel: 1.61 cm2
AV Area VTI: 1.59 cm2
AV Area mean vel: 1.74 cm2
AV Mean grad: 8 mmHg
AV Peak grad: 16.8 mmHg
Ao pk vel: 2.05 m/s
Area-P 1/2: 2.56 cm2
MV M vel: 5.61 m/s
MV Peak grad: 125.9 mmHg
S' Lateral: 2.8 cm

## 2021-12-22 ENCOUNTER — Other Ambulatory Visit: Payer: Self-pay | Admitting: *Deleted

## 2021-12-22 ENCOUNTER — Telehealth: Payer: Self-pay

## 2021-12-22 DIAGNOSIS — E785 Hyperlipidemia, unspecified: Secondary | ICD-10-CM

## 2021-12-22 MED ORDER — ROSUVASTATIN CALCIUM 10 MG PO TABS: 10.0000 mg | ORAL_TABLET | Freq: Every day | ORAL | 3 refills | Status: AC

## 2021-12-22 NOTE — Telephone Encounter (Signed)
Pt notified and verbalized understanding. Pt had no questions or concerns at this time.  

## 2021-12-31 DIAGNOSIS — E782 Mixed hyperlipidemia: Secondary | ICD-10-CM | POA: Diagnosis not present

## 2021-12-31 DIAGNOSIS — R7301 Impaired fasting glucose: Secondary | ICD-10-CM | POA: Diagnosis not present

## 2021-12-31 DIAGNOSIS — L299 Pruritus, unspecified: Secondary | ICD-10-CM | POA: Diagnosis not present

## 2022-01-06 DIAGNOSIS — M79605 Pain in left leg: Secondary | ICD-10-CM | POA: Diagnosis not present

## 2022-01-06 DIAGNOSIS — G894 Chronic pain syndrome: Secondary | ICD-10-CM | POA: Diagnosis not present

## 2022-01-06 DIAGNOSIS — R944 Abnormal results of kidney function studies: Secondary | ICD-10-CM | POA: Diagnosis not present

## 2022-01-06 DIAGNOSIS — H409 Unspecified glaucoma: Secondary | ICD-10-CM | POA: Diagnosis not present

## 2022-01-06 DIAGNOSIS — M79604 Pain in right leg: Secondary | ICD-10-CM | POA: Diagnosis not present

## 2022-01-06 DIAGNOSIS — E1169 Type 2 diabetes mellitus with other specified complication: Secondary | ICD-10-CM | POA: Diagnosis not present

## 2022-01-06 DIAGNOSIS — G2581 Restless legs syndrome: Secondary | ICD-10-CM | POA: Diagnosis not present

## 2022-01-06 DIAGNOSIS — Z853 Personal history of malignant neoplasm of breast: Secondary | ICD-10-CM | POA: Diagnosis not present

## 2022-01-06 DIAGNOSIS — E039 Hypothyroidism, unspecified: Secondary | ICD-10-CM | POA: Diagnosis not present

## 2022-01-06 DIAGNOSIS — E559 Vitamin D deficiency, unspecified: Secondary | ICD-10-CM | POA: Diagnosis not present

## 2022-01-06 DIAGNOSIS — T7849XA Other allergy, initial encounter: Secondary | ICD-10-CM | POA: Diagnosis not present

## 2022-01-19 DIAGNOSIS — M19012 Primary osteoarthritis, left shoulder: Secondary | ICD-10-CM | POA: Diagnosis not present

## 2022-01-25 DIAGNOSIS — E039 Hypothyroidism, unspecified: Secondary | ICD-10-CM | POA: Diagnosis not present

## 2022-01-25 DIAGNOSIS — E119 Type 2 diabetes mellitus without complications: Secondary | ICD-10-CM | POA: Diagnosis not present

## 2022-01-25 DIAGNOSIS — I10 Essential (primary) hypertension: Secondary | ICD-10-CM | POA: Diagnosis not present

## 2022-01-26 DIAGNOSIS — R252 Cramp and spasm: Secondary | ICD-10-CM | POA: Diagnosis not present

## 2022-01-26 DIAGNOSIS — G894 Chronic pain syndrome: Secondary | ICD-10-CM | POA: Diagnosis not present

## 2022-01-28 ENCOUNTER — Other Ambulatory Visit: Payer: Self-pay | Admitting: Internal Medicine

## 2022-01-28 ENCOUNTER — Other Ambulatory Visit (HOSPITAL_COMMUNITY): Payer: Self-pay | Admitting: Internal Medicine

## 2022-01-28 DIAGNOSIS — M545 Low back pain, unspecified: Secondary | ICD-10-CM

## 2022-01-30 DIAGNOSIS — R110 Nausea: Secondary | ICD-10-CM | POA: Diagnosis not present

## 2022-01-30 DIAGNOSIS — K0510 Chronic gingivitis, plaque induced: Secondary | ICD-10-CM | POA: Diagnosis not present

## 2022-02-11 DIAGNOSIS — H2513 Age-related nuclear cataract, bilateral: Secondary | ICD-10-CM | POA: Diagnosis not present

## 2022-02-11 DIAGNOSIS — H40013 Open angle with borderline findings, low risk, bilateral: Secondary | ICD-10-CM | POA: Diagnosis not present

## 2022-02-20 ENCOUNTER — Other Ambulatory Visit: Payer: Medicare Other

## 2022-02-26 DIAGNOSIS — E782 Mixed hyperlipidemia: Secondary | ICD-10-CM | POA: Diagnosis not present

## 2022-02-26 DIAGNOSIS — E1169 Type 2 diabetes mellitus with other specified complication: Secondary | ICD-10-CM | POA: Diagnosis not present

## 2022-02-26 DIAGNOSIS — I10 Essential (primary) hypertension: Secondary | ICD-10-CM | POA: Diagnosis not present

## 2022-03-17 DIAGNOSIS — H2513 Age-related nuclear cataract, bilateral: Secondary | ICD-10-CM | POA: Diagnosis not present

## 2022-04-09 DIAGNOSIS — Z1283 Encounter for screening for malignant neoplasm of skin: Secondary | ICD-10-CM | POA: Diagnosis not present

## 2022-04-09 DIAGNOSIS — L820 Inflamed seborrheic keratosis: Secondary | ICD-10-CM | POA: Diagnosis not present

## 2022-04-09 DIAGNOSIS — D225 Melanocytic nevi of trunk: Secondary | ICD-10-CM | POA: Diagnosis not present

## 2022-04-09 DIAGNOSIS — B078 Other viral warts: Secondary | ICD-10-CM | POA: Diagnosis not present

## 2022-04-16 DIAGNOSIS — E1169 Type 2 diabetes mellitus with other specified complication: Secondary | ICD-10-CM | POA: Diagnosis not present

## 2022-04-16 DIAGNOSIS — E039 Hypothyroidism, unspecified: Secondary | ICD-10-CM | POA: Diagnosis not present

## 2022-04-20 DIAGNOSIS — I739 Peripheral vascular disease, unspecified: Secondary | ICD-10-CM | POA: Diagnosis not present

## 2022-04-20 DIAGNOSIS — Z634 Disappearance and death of family member: Secondary | ICD-10-CM | POA: Diagnosis not present

## 2022-04-20 DIAGNOSIS — E039 Hypothyroidism, unspecified: Secondary | ICD-10-CM | POA: Diagnosis not present

## 2022-04-20 DIAGNOSIS — E1169 Type 2 diabetes mellitus with other specified complication: Secondary | ICD-10-CM | POA: Diagnosis not present

## 2022-04-20 DIAGNOSIS — G894 Chronic pain syndrome: Secondary | ICD-10-CM | POA: Diagnosis not present

## 2022-04-20 DIAGNOSIS — E782 Mixed hyperlipidemia: Secondary | ICD-10-CM | POA: Diagnosis not present

## 2022-04-20 DIAGNOSIS — Z0000 Encounter for general adult medical examination without abnormal findings: Secondary | ICD-10-CM | POA: Diagnosis not present

## 2022-04-20 DIAGNOSIS — M79604 Pain in right leg: Secondary | ICD-10-CM | POA: Diagnosis not present

## 2022-04-20 DIAGNOSIS — M79605 Pain in left leg: Secondary | ICD-10-CM | POA: Diagnosis not present

## 2022-04-20 DIAGNOSIS — R079 Chest pain, unspecified: Secondary | ICD-10-CM | POA: Diagnosis not present

## 2022-04-20 DIAGNOSIS — R944 Abnormal results of kidney function studies: Secondary | ICD-10-CM | POA: Diagnosis not present

## 2022-04-29 DIAGNOSIS — M19012 Primary osteoarthritis, left shoulder: Secondary | ICD-10-CM | POA: Diagnosis not present

## 2022-05-05 ENCOUNTER — Telehealth: Payer: Self-pay | Admitting: Internal Medicine

## 2022-05-05 DIAGNOSIS — E785 Hyperlipidemia, unspecified: Secondary | ICD-10-CM

## 2022-05-05 NOTE — Telephone Encounter (Signed)
Received labs from PCP for lipids   LDL was 129     Is she taking Crestor?    LDL should be lower since she has atherosclerosis.   If so then I would add Zetia 10 mg   Keep on Crestor    Follow up lipomed in 8 wks with liver panel

## 2022-05-08 MED ORDER — EZETIMIBE 10 MG PO TABS
10.0000 mg | ORAL_TABLET | Freq: Every day | ORAL | 3 refills | Status: AC
Start: 2022-05-08 — End: ?

## 2022-05-08 NOTE — Telephone Encounter (Signed)
Spoke with the patient and advised on recommendations per Dr. Harrington Challenger.  Patient reports that she is still taking Crestor but has cut the dose in half due to leg cramps and pain. She is taking 5 mg daily. She states that cramping has improved although she does have pain in her shins still. She is willing to try zetia 10 mg daily. She is already scheduled for lab work at her PCP 07/31/2022.

## 2022-05-08 NOTE — Addendum Note (Signed)
Addended by: Bernestine Amass on: 05/08/2022 10:23 AM   Modules accepted: Orders

## 2022-05-15 ENCOUNTER — Ambulatory Visit: Payer: Medicare Other | Admitting: Internal Medicine

## 2022-06-11 DIAGNOSIS — Z2820 Immunization not carried out because of patient decision for unspecified reason: Secondary | ICD-10-CM | POA: Diagnosis not present

## 2022-06-11 DIAGNOSIS — G894 Chronic pain syndrome: Secondary | ICD-10-CM | POA: Diagnosis not present

## 2022-06-11 DIAGNOSIS — E782 Mixed hyperlipidemia: Secondary | ICD-10-CM | POA: Diagnosis not present

## 2022-06-11 DIAGNOSIS — G720 Drug-induced myopathy: Secondary | ICD-10-CM | POA: Diagnosis not present

## 2022-06-24 DIAGNOSIS — H539 Unspecified visual disturbance: Secondary | ICD-10-CM | POA: Diagnosis not present

## 2022-06-24 DIAGNOSIS — I10 Essential (primary) hypertension: Secondary | ICD-10-CM | POA: Diagnosis not present

## 2022-07-13 DIAGNOSIS — M19012 Primary osteoarthritis, left shoulder: Secondary | ICD-10-CM | POA: Diagnosis not present

## 2022-07-14 DIAGNOSIS — L300 Nummular dermatitis: Secondary | ICD-10-CM | POA: Diagnosis not present

## 2022-07-14 DIAGNOSIS — G894 Chronic pain syndrome: Secondary | ICD-10-CM | POA: Diagnosis not present

## 2022-07-14 DIAGNOSIS — G8929 Other chronic pain: Secondary | ICD-10-CM | POA: Diagnosis not present

## 2022-07-14 DIAGNOSIS — B372 Candidiasis of skin and nail: Secondary | ICD-10-CM | POA: Diagnosis not present

## 2022-08-05 DIAGNOSIS — E1169 Type 2 diabetes mellitus with other specified complication: Secondary | ICD-10-CM | POA: Diagnosis not present

## 2022-08-05 DIAGNOSIS — I10 Essential (primary) hypertension: Secondary | ICD-10-CM | POA: Diagnosis not present

## 2022-08-05 DIAGNOSIS — E039 Hypothyroidism, unspecified: Secondary | ICD-10-CM | POA: Diagnosis not present

## 2022-08-05 DIAGNOSIS — E782 Mixed hyperlipidemia: Secondary | ICD-10-CM | POA: Diagnosis not present

## 2022-08-19 DIAGNOSIS — S5012XA Contusion of left forearm, initial encounter: Secondary | ICD-10-CM | POA: Diagnosis not present

## 2022-08-19 DIAGNOSIS — W19XXXA Unspecified fall, initial encounter: Secondary | ICD-10-CM | POA: Diagnosis not present

## 2022-08-19 DIAGNOSIS — S51802A Unspecified open wound of left forearm, initial encounter: Secondary | ICD-10-CM | POA: Diagnosis not present

## 2022-08-19 DIAGNOSIS — S51812A Laceration without foreign body of left forearm, initial encounter: Secondary | ICD-10-CM | POA: Diagnosis not present

## 2022-09-17 DIAGNOSIS — L300 Nummular dermatitis: Secondary | ICD-10-CM | POA: Diagnosis not present

## 2022-09-17 DIAGNOSIS — L304 Erythema intertrigo: Secondary | ICD-10-CM | POA: Diagnosis not present

## 2022-09-30 DIAGNOSIS — M79605 Pain in left leg: Secondary | ICD-10-CM | POA: Diagnosis not present

## 2022-09-30 DIAGNOSIS — E1151 Type 2 diabetes mellitus with diabetic peripheral angiopathy without gangrene: Secondary | ICD-10-CM | POA: Diagnosis not present

## 2022-09-30 DIAGNOSIS — G894 Chronic pain syndrome: Secondary | ICD-10-CM | POA: Diagnosis not present

## 2022-09-30 DIAGNOSIS — M79604 Pain in right leg: Secondary | ICD-10-CM | POA: Diagnosis not present

## 2022-09-30 DIAGNOSIS — E039 Hypothyroidism, unspecified: Secondary | ICD-10-CM | POA: Diagnosis not present

## 2022-09-30 DIAGNOSIS — R944 Abnormal results of kidney function studies: Secondary | ICD-10-CM | POA: Diagnosis not present

## 2022-09-30 DIAGNOSIS — G720 Drug-induced myopathy: Secondary | ICD-10-CM | POA: Diagnosis not present

## 2022-09-30 DIAGNOSIS — R809 Proteinuria, unspecified: Secondary | ICD-10-CM | POA: Diagnosis not present

## 2022-09-30 DIAGNOSIS — E782 Mixed hyperlipidemia: Secondary | ICD-10-CM | POA: Diagnosis not present

## 2022-09-30 DIAGNOSIS — I10 Essential (primary) hypertension: Secondary | ICD-10-CM | POA: Diagnosis not present

## 2022-09-30 DIAGNOSIS — E1169 Type 2 diabetes mellitus with other specified complication: Secondary | ICD-10-CM | POA: Diagnosis not present

## 2022-10-03 DIAGNOSIS — S61239A Puncture wound without foreign body of unspecified finger without damage to nail, initial encounter: Secondary | ICD-10-CM | POA: Diagnosis not present

## 2022-10-03 DIAGNOSIS — Z23 Encounter for immunization: Secondary | ICD-10-CM | POA: Diagnosis not present

## 2022-10-03 DIAGNOSIS — S61231A Puncture wound without foreign body of left index finger without damage to nail, initial encounter: Secondary | ICD-10-CM | POA: Diagnosis not present

## 2022-10-03 DIAGNOSIS — W273XXA Contact with needle (sewing), initial encounter: Secondary | ICD-10-CM | POA: Diagnosis not present

## 2022-10-05 ENCOUNTER — Telehealth: Payer: Self-pay | Admitting: Internal Medicine

## 2022-10-05 NOTE — Telephone Encounter (Signed)
Patient is requesting to switch from Dr. Tenny Craw to Dr. Anne Fu - willing to come to [facility] to see Dr. Anne Fu.

## 2022-10-14 DIAGNOSIS — M542 Cervicalgia: Secondary | ICD-10-CM | POA: Diagnosis not present

## 2022-10-14 DIAGNOSIS — M19012 Primary osteoarthritis, left shoulder: Secondary | ICD-10-CM | POA: Diagnosis not present

## 2022-10-14 NOTE — Telephone Encounter (Signed)
Called pt to sch with Dr. Anne Fu and offered appt in May. She states she wants to stick with Dr. Tenny Craw now. Will keep appt as is.

## 2022-10-17 DIAGNOSIS — S61239D Puncture wound without foreign body of unspecified finger without damage to nail, subsequent encounter: Secondary | ICD-10-CM | POA: Diagnosis not present

## 2022-10-17 DIAGNOSIS — S61231A Puncture wound without foreign body of left index finger without damage to nail, initial encounter: Secondary | ICD-10-CM | POA: Diagnosis not present

## 2022-10-21 DIAGNOSIS — D225 Melanocytic nevi of trunk: Secondary | ICD-10-CM | POA: Diagnosis not present

## 2022-10-21 DIAGNOSIS — L298 Other pruritus: Secondary | ICD-10-CM | POA: Diagnosis not present

## 2022-10-21 DIAGNOSIS — L821 Other seborrheic keratosis: Secondary | ICD-10-CM | POA: Diagnosis not present

## 2022-10-21 DIAGNOSIS — Z789 Other specified health status: Secondary | ICD-10-CM | POA: Diagnosis not present

## 2022-10-21 DIAGNOSIS — L814 Other melanin hyperpigmentation: Secondary | ICD-10-CM | POA: Diagnosis not present

## 2022-10-21 DIAGNOSIS — L853 Xerosis cutis: Secondary | ICD-10-CM | POA: Diagnosis not present

## 2022-10-21 DIAGNOSIS — L538 Other specified erythematous conditions: Secondary | ICD-10-CM | POA: Diagnosis not present

## 2022-10-21 DIAGNOSIS — L820 Inflamed seborrheic keratosis: Secondary | ICD-10-CM | POA: Diagnosis not present

## 2022-11-11 DIAGNOSIS — E782 Mixed hyperlipidemia: Secondary | ICD-10-CM | POA: Diagnosis not present

## 2022-11-11 DIAGNOSIS — I10 Essential (primary) hypertension: Secondary | ICD-10-CM | POA: Diagnosis not present

## 2022-11-11 DIAGNOSIS — E039 Hypothyroidism, unspecified: Secondary | ICD-10-CM | POA: Diagnosis not present

## 2022-11-16 ENCOUNTER — Ambulatory Visit: Payer: Medicare Other | Admitting: Internal Medicine

## 2022-11-17 DIAGNOSIS — H539 Unspecified visual disturbance: Secondary | ICD-10-CM | POA: Diagnosis not present

## 2022-11-17 DIAGNOSIS — G720 Drug-induced myopathy: Secondary | ICD-10-CM | POA: Diagnosis not present

## 2022-11-17 DIAGNOSIS — E782 Mixed hyperlipidemia: Secondary | ICD-10-CM | POA: Diagnosis not present

## 2022-11-17 DIAGNOSIS — R944 Abnormal results of kidney function studies: Secondary | ICD-10-CM | POA: Diagnosis not present

## 2022-11-17 DIAGNOSIS — M79604 Pain in right leg: Secondary | ICD-10-CM | POA: Diagnosis not present

## 2022-11-17 DIAGNOSIS — I10 Essential (primary) hypertension: Secondary | ICD-10-CM | POA: Diagnosis not present

## 2022-11-17 DIAGNOSIS — G894 Chronic pain syndrome: Secondary | ICD-10-CM | POA: Diagnosis not present

## 2022-11-17 DIAGNOSIS — M79605 Pain in left leg: Secondary | ICD-10-CM | POA: Diagnosis not present

## 2022-11-17 DIAGNOSIS — E039 Hypothyroidism, unspecified: Secondary | ICD-10-CM | POA: Diagnosis not present

## 2022-11-17 DIAGNOSIS — E1169 Type 2 diabetes mellitus with other specified complication: Secondary | ICD-10-CM | POA: Diagnosis not present

## 2022-11-17 DIAGNOSIS — I739 Peripheral vascular disease, unspecified: Secondary | ICD-10-CM | POA: Diagnosis not present

## 2022-12-15 DIAGNOSIS — G894 Chronic pain syndrome: Secondary | ICD-10-CM | POA: Diagnosis not present

## 2022-12-15 DIAGNOSIS — E039 Hypothyroidism, unspecified: Secondary | ICD-10-CM | POA: Diagnosis not present

## 2022-12-15 DIAGNOSIS — G4700 Insomnia, unspecified: Secondary | ICD-10-CM | POA: Diagnosis not present

## 2022-12-15 DIAGNOSIS — R42 Dizziness and giddiness: Secondary | ICD-10-CM | POA: Diagnosis not present

## 2022-12-15 DIAGNOSIS — R7301 Impaired fasting glucose: Secondary | ICD-10-CM | POA: Diagnosis not present

## 2022-12-15 DIAGNOSIS — Z7182 Exercise counseling: Secondary | ICD-10-CM | POA: Diagnosis not present

## 2022-12-15 DIAGNOSIS — Z713 Dietary counseling and surveillance: Secondary | ICD-10-CM | POA: Diagnosis not present

## 2022-12-15 DIAGNOSIS — I10 Essential (primary) hypertension: Secondary | ICD-10-CM | POA: Diagnosis not present

## 2022-12-15 DIAGNOSIS — E1169 Type 2 diabetes mellitus with other specified complication: Secondary | ICD-10-CM | POA: Diagnosis not present

## 2022-12-15 DIAGNOSIS — Z79891 Long term (current) use of opiate analgesic: Secondary | ICD-10-CM | POA: Diagnosis not present

## 2022-12-17 DIAGNOSIS — L110 Acquired keratosis follicularis: Secondary | ICD-10-CM | POA: Diagnosis not present

## 2022-12-17 DIAGNOSIS — M7751 Other enthesopathy of right foot: Secondary | ICD-10-CM | POA: Diagnosis not present

## 2022-12-17 DIAGNOSIS — M79674 Pain in right toe(s): Secondary | ICD-10-CM | POA: Diagnosis not present

## 2022-12-17 DIAGNOSIS — M2041 Other hammer toe(s) (acquired), right foot: Secondary | ICD-10-CM | POA: Diagnosis not present

## 2022-12-17 DIAGNOSIS — M79671 Pain in right foot: Secondary | ICD-10-CM | POA: Diagnosis not present

## 2023-01-13 DIAGNOSIS — M19012 Primary osteoarthritis, left shoulder: Secondary | ICD-10-CM | POA: Diagnosis not present

## 2023-01-15 ENCOUNTER — Ambulatory Visit (HOSPITAL_COMMUNITY): Payer: Medicare Other | Admitting: Physical Therapy

## 2023-01-28 ENCOUNTER — Ambulatory Visit: Payer: Medicare Other | Admitting: Internal Medicine

## 2023-01-29 DIAGNOSIS — R42 Dizziness and giddiness: Secondary | ICD-10-CM | POA: Diagnosis not present

## 2023-01-29 DIAGNOSIS — L300 Nummular dermatitis: Secondary | ICD-10-CM | POA: Diagnosis not present

## 2023-02-04 NOTE — Therapy (Deleted)
OUTPATIENT PHYSICAL THERAPY VESTIBULAR EVALUATION     Patient Name: Teresa Anderson MRN: [id number] DOB:April 09, 1941, 82 y.o., female Today's Date: 02/04/2023  END OF SESSION:   Past Medical History:  Diagnosis Date   Allergy    Arthritis    Asthma    Blood transfusion without reported diagnosis    with cancer surgery   Cancer ([facility])    breast and skin   Diabetes mellitus without complication (HCC)    Hyperlipidemia    Hypertension    Lymphedema of leg    Thyroid disease    Varicose veins of bilateral lower extremities with other complications    Past Surgical History:  Procedure Laterality Date   ABDOMINAL HYSTERECTOMY     BREAST SURGERY     bilat   CESAREAN SECTION     x4   CHOLECYSTECTOMY     COSMETIC SURGERY     reconstruction   MASTECTOMY     bilateral   Patient Active Problem List   Diagnosis Date Noted   Ganglion cyst of flexor tendon sheath of finger 12/03/2018   Pain in finger of right hand 12/02/2018   Sjogren's syndrome with keratoconjunctivitis sicca (HCC) 04/22/2018   Primary osteoarthritis of both hands 04/22/2018   Primary osteoarthritis of both feet 04/22/2018   DDD (degenerative disc disease), lumbar 04/22/2018   Primary osteoarthritis of left shoulder 04/22/2018   Abdominal hernia without obstruction or gangrene 10/12/2016   Acute pain of left shoulder 10/08/2016   Varicose veins of bilateral lower extremities with other complications 06/01/2016   Essential hypertension 03/16/2016   Lymphedema 03/16/2016   Breast cancer in female [facility]) 03/16/2016   Hypothyroidism 03/16/2016   Degenerative joint disease (DJD) of lumbar spine 03/16/2016   History of bilateral knee replacement 03/16/2016   Prediabetes 03/16/2016   Obesity 03/16/2016   Hyperlipidemia 03/16/2016   Chronic pain syndrome 03/16/2016    PCP: Nita Sells MD REFERRING PROVIDER: Nita Sells MD  REFERRING DIAG: dizziness and giddiness  THERAPY DIAG: dizziness and  giddiness   ONSET DATE: ***  Rationale for Evaluation and Treatment: Rehabilitation  SUBJECTIVE:   SUBJECTIVE STATEMENT: Pt is an 82 yo female who states that she has been referred her for her dizziness.   PERTINENT HISTORY: chronic pain syndrome, HTN, OA,   PAIN:  Are you having pain? Yes: NPRS scale: ***/10; this is chronic and we are not treating this condition  Pain location: *** Pain description: *** Aggravating factors: *** Relieving factors: ***  PRECAUTIONS: None   WEIGHT BEARING RESTRICTIONS: No  FALLS: Has patient fallen in last 6 months? No  LIVING ENVIRONMENT: Lives with: {[facility]RC lives with:25569::"lives with their family"} Lives in: {Lives in:25570} Stairs: {opstairs:27293} Has following equipment at home: {Assistive devices:23999}  PLOF: Independent with basic ADLs  PATIENT GOALS: ***  OBJECTIVE:   COGNITION: Overall cognitive status: Within functional limits for tasks assessed  POSTURE:  {posture:25561}  Cervical ROM:    Active A/PROM (deg) eval  Flexion   Extension   Right lateral flexion   Left lateral flexion   Right rotation   Left rotation   (Blank rows = not tested)  PATIENT SURVEYS:  {rehab surveys:24030}  VESTIBULAR ASSESSMENT: SYMPTOM BEHAVIOR:  Subjective history: ***  Non-Vestibular symptoms: {nonvestibular symptoms:25260}  Type of dizziness: {Type of Dizziness:25255}  Frequency: ***  Duration: ***  Aggravating factors: {Aggravating Factors:25258}  Relieving factors: {Relieving Factors:25259}  Progression of symptoms: {DESC; BETTER/WORSE:18575}  OCULOMOTOR EXAM:  Ocular Alignment: {Ocular Alignment:25262}  Ocular ROM: {RANGE OF MOTION:21649}  Spontaneous Nystagmus: {Spontaneous nystagmus:25263}  Gaze-Induced Nystagmus: {gaze-induced nystagmus:25264}  Smooth Pursuits: {smooth pursuit:25265}  Saccades: {saccades:25266}  VESTIBULAR - OCULAR REFLEX:   Slow VOR: {slow VOR:25290}  VOR Cancellation: {vor  cancellation:25291}  Head-Impulse Test: {head impulse test:25272}     POSITIONAL TESTING: {Positional tests:25271}  MOTION SENSITIVITY:  Motion Sensitivity Quotient Intensity: 0 = none, 1 = Lightheaded, 2 = Mild, 3 = Moderate, 4 = Severe, 5 = Vomiting  Intensity  1. Sitting to supine   2. Supine to L side   3. Supine to R side   4. Supine to sitting   5. L Hallpike-Dix   6. Up from L    7. R Hallpike-Dix   8. Up from R    9. Sitting, head tipped to L knee   10. Head up from L knee   11. Sitting, head tipped to R knee   12. Head up from R knee   13. Sitting head turns x5   14.Sitting head nods x5   15. In stance, 180 turn to L    16. In stance, 180 turn to R         VESTIBULAR TREATMENT:                                                                                                   DATE: ***  Canalith Repositioning:  {Canalith Repositioning:25283} Gaze Adaptation:  {gaze adaptation:25286} Habituation:  {habituation:25288} Other: ***  PATIENT EDUCATION: Education details: *** Person educated: {Person educated:25204} Education method: {Education Method:25205} Education comprehension: {Education Comprehension:25206}  HOME EXERCISE PROGRAM:  GOALS: Goals reviewed with patient? No  SHORT TERM GOALS: Target date: ***  *** Baseline: Goal status: {GOALSTATUS:25110}  2.  *** Baseline:  Goal status: {GOALSTATUS:25110}  3.  *** Baseline:  Goal status: {GOALSTATUS:25110}  4.  *** Baseline:  Goal status: {GOALSTATUS:25110}  5.  *** Baseline:  Goal status: {GOALSTATUS:25110}  6.  *** Baseline:  Goal status: {GOALSTATUS:25110}  LONG TERM GOALS: Target date: ***  *** Baseline:  Goal status: {GOALSTATUS:25110}  2.  *** Baseline:  Goal status: {GOALSTATUS:25110}  3.  *** Baseline:  Goal status: {GOALSTATUS:25110}  4.  *** Baseline:  Goal status: {GOALSTATUS:25110}  5.  *** Baseline:  Goal status: {GOALSTATUS:25110}  6.   *** Baseline:  Goal status: {GOALSTATUS:25110}  ASSESSMENT:  CLINICAL IMPRESSION: Patient is an 82 y.o. female who was seen today for physical therapy evaluation and treatment for dizziness.   OBJECTIVE IMPAIRMENTS: decreased balance, decreased ROM, dizziness, and pain.   ACTIVITY LIMITATIONS: {activitylimitations:27494}  PARTICIPATION LIMITATIONS: shopping and community activity  PERSONAL FACTORS: Age and Time since onset of injury/illness/exacerbation are also affecting patient's functional outcome.   REHAB POTENTIAL: {rehabpotential:25112}  CLINICAL DECISION MAKING: {clinical decision making:25114}  EVALUATION COMPLEXITY: {Evaluation complexity:25115}   PLAN:  PT FREQUENCY: {rehab frequency:25116}  PT DURATION: {rehab duration:25117}  PLANNED INTERVENTIONS: Therapeutic exercises, Neuromuscular re-education, Balance training, Patient/Family education, Self Care, and Manual therapy  PLAN FOR NEXT SESSION: ***   ,CINDY, PT 02/04/2023, 2:21 PM

## 2023-02-05 ENCOUNTER — Ambulatory Visit (HOSPITAL_COMMUNITY): Payer: Medicare Other | Admitting: Physical Therapy

## 2023-03-11 ENCOUNTER — Ambulatory Visit (HOSPITAL_COMMUNITY): Payer: Medicare Other | Attending: Internal Medicine | Admitting: Physical Therapy

## 2023-03-11 ENCOUNTER — Other Ambulatory Visit: Payer: Self-pay

## 2023-03-11 DIAGNOSIS — R42 Dizziness and giddiness: Secondary | ICD-10-CM

## 2023-03-11 NOTE — Therapy (Signed)
OUTPATIENT PHYSICAL THERAPY VESTIBULAR EVALUATION     Patient Name: Teresa Anderson MRN: [id number] DOB:02-29-1941, 82 y.o., female Today's Date: 03/11/2023  END OF SESSION:  PT End of Session - 03/11/23 1610     Visit Number 1    Number of Visits 4    Date for PT Re-Evaluation 05/06/23    Authorization Type [employer] Medicare    Progress Note Due on Visit 4    PT Start Time 1345    PT Stop Time 1425    PT Time Calculation (min) 40 min    Activity Tolerance Patient tolerated treatment well    Behavior During Therapy Restless;Anxious             Past Medical History:  Diagnosis Date   Allergy    Arthritis    Asthma    Blood transfusion without reported diagnosis    with cancer surgery   Cancer ([facility]CC)    breast and skin   Diabetes mellitus without complication (HCC)    Hyperlipidemia    Hypertension    Lymphedema of leg    Thyroid disease    Varicose veins of bilateral lower extremities with other complications    Past Surgical History:  Procedure Laterality Date   ABDOMINAL HYSTERECTOMY     BREAST SURGERY     bilat   CESAREAN SECTION     x4   CHOLECYSTECTOMY     COSMETIC SURGERY     reconstruction   MASTECTOMY     bilateral   Patient Active Problem List   Diagnosis Date Noted   Ganglion cyst of flexor tendon sheath of finger 12/03/2018   Pain in finger of right hand 12/02/2018   Sjogren's syndrome with keratoconjunctivitis sicca (HCC) 04/22/2018   Primary osteoarthritis of both hands 04/22/2018   Primary osteoarthritis of both feet 04/22/2018   DDD (degenerative disc disease), lumbar 04/22/2018   Primary osteoarthritis of left shoulder 04/22/2018   Abdominal hernia without obstruction or gangrene 10/12/2016   Acute pain of left shoulder 10/08/2016   Varicose veins of bilateral lower extremities with other complications 06/01/2016   Essential hypertension 03/16/2016   Lymphedema 03/16/2016   Breast cancer in female [facility]) 03/16/2016   Hypothyroidism  03/16/2016   Degenerative joint disease (DJD) of lumbar spine 03/16/2016   History of bilateral knee replacement 03/16/2016   Prediabetes 03/16/2016   Obesity 03/16/2016   Hyperlipidemia 03/16/2016   Chronic pain syndrome 03/16/2016    PCP: Nita Sells REFERRING PROVIDER: Benita Stabile, MD  REFERRING DIAG: R42 (ICD-10-CM) - Dizziness and giddiness  THERAPY DIAG:  Dizziness  ONSET DATE: 08/28/22  Rationale for Evaluation and Treatment: Rehabilitation  SUBJECTIVE:   SUBJECTIVE STATEMENT: Pt states that she gets dizzy randomly.  The last time she became dizzy was about two days ago.  She has fallen once in the past six months.  This has been going on for months Pt accompanied by: self  PERTINENT HISTORY: B TKR, breast cancer with B mastectomy.   PAIN:  Are you having pain? Yes: NPRS scale: 7/10 Pain location: B leg pain but this is not what we will be addressing  Pain description: throbbing   PRECAUTIONS: Fall    WEIGHT BEARING RESTRICTIONS: No  FALLS: Has patient fallen in last 6 months? No  LIVING ENVIRONMENT: Lives with: lives alone Lives in: House/apartment Stairs: No Has following equipment at home: None  PLOF: Independent  PATIENT GOALS: to have no more dizziness   OBJECTIVE:    COGNITION: Overall cognitive  status: Within functional limits for tasks assessed     Cervical ROM:    Active A/PROM (deg) eval  Flexion   Extension   Right lateral flexion   Left lateral flexion   Right rotation 77  Left rotation 35  (Blank rows = not tested)     VESTIBULAR ASSESSMENT:   SYMPTOM BEHAVIOR:  Subjective history: PT states that her sx of dizziness is very variable.  She mainly becomes dizzy if she is standing and carrying items.   Non-Vestibular symptoms:  spasms behind her eyes.  This is variable as well sometimes daily and sometimes every two weeks.   Type of dizziness: Imbalance (Disequilibrium) and Unsteady with head/body turns  Frequency: spasms  behind her eyes.  This is variable as well sometimes daily and sometimes every two weeks.   Duration: less than a minute   Aggravating factors: No known aggravating factors  Relieving factors: no known relieving factors  Progression of symptoms: unchanged  OCULOMOTOR EXAM:  Ocular Alignment: normal  Ocular ROM: No Limitations  Spontaneous Nystagmus: absent  Gaze-Induced Nystagmus: absent- to the left makes her eyes ache but no dizziness   Smooth Pursuits: intact  Saccades: intact  VESTIBULAR - OCULAR REFLEX:   Slow VOR: Normal  VOR Cancellation: Unable to Maintain Gaze  Head-Impulse Test: negative   POSITIONAL TESTING: Other: PT is unable to tolerate any testing  Single leg stance: RT: 4   Lt : 1  MOTION SENSITIVITY:  Motion Sensitivity Quotient Intensity: 0 = none, 1 = Lightheaded, 2 = Mild, 3 = Moderate, 4 = Severe, 5 = Vomiting  Intensity  1. Sitting to supine   2. Supine to L side   3. Supine to R side   4. Supine to sitting   5. L Hallpike-Dix   6. Up from L    7. R Hallpike-Dix   8. Up from R    9. Sitting, head tipped to L knee   10. Head up from L knee   11. Sitting, head tipped to R knee   12. Head up from R knee   13. Sitting head turns x5   14.Sitting head nods x5   15. In stance, 180 turn to L    16. In stance, 180 turn to R      Foto: 52   VESTIBULAR TREATMENT:                                                                                                   DATE: 03/11/23 Gaze Adaptation:  x1 Viewing Horizontal: Position: sitting  Habituation:  Compensatory Saccades: number of reps: 5 Other: single leg stance Cervical rotation   PATIENT EDUCATION: Education details: HEP Person educated: Patient Education method: Explanation and Handouts Education comprehension: verbalized understanding  HOME EXERCISE PROGRAM: Access Code: [email or web address] URL: [email or web address] Date: 03/11/2023 Prepared by: Virgina Organ  Exercises - Seated  Cervical Rotation AROM  - 3 x daily - 7 x weekly - 1 sets - 5 reps - 5-10 seconds  hold - Standing Single Leg Stance with Counter Support  - 3 x daily -  7 x weekly - 1 sets - 5 reps - as long as possible  hold GOALS: Goals reviewed with patient? Yes  SHORT TERM GOALS: Target date: 04/08/23  PT to be I in her HEP in order to decrease her sx of dizziness by 50% to reduce risk of falling  Baseline: Goal status: INITIAL  2.  PT to be able to single leg stance  B for 10 seconds to reduce risk of falling  Baseline:  Goal status: INITIAL  3.  PT to be able to rotate her cervical spine to the left 50 degrees to assist in decreasing dizziness Baseline:  Goal status: INITIAL  LONG TERM GOALS: Target date: 05/06/23  PT to be I in her HEP in order to decrease her sx of dizziness by 75% to reduce risk of falling  Baseline:  Goal status: INITIAL  2.  PT to be able to single leg stance  B for 15 seconds to reduce risk of falling  Baseline:  Goal status: INITIAL  3.  PT cervical rotation to be at least 60 degrees  Baseline:  Goal status: INITIAL    ASSESSMENT:  CLINICAL IMPRESSION: Patient is a 82 y.o. female  who was seen today for physical therapy evaluation and treatment for dizziness.  Evaluation demonstrates decreased cervical ROM, decreased balance and dizziness.  Ms. Elliott will benefit from skilled therapy to address these issues and reduce her risk of falling.    OBJECTIVE IMPAIRMENTS: decreased balance and decreased ROM.   ACTIVITY LIMITATIONS: carrying, lifting, and locomotion level  PARTICIPATION LIMITATIONS: community activity  PERSONAL FACTORS: Behavior pattern and 3+ comorbidities: chronic knee, shoulder and neck pain   are also affecting patient's functional outcome.   REHAB POTENTIAL: Fair    CLINICAL DECISION MAKING: Stable/uncomplicated  EVALUATION COMPLEXITY: Moderate   PLAN:  PT FREQUENCY: every other week  PT DURATION: 8 weeks  PLANNED  INTERVENTIONS: Therapeutic exercises, Neuromuscular re-education, Balance training, Patient/Family education, Self Care, and Manual therapy  PLAN FOR NEXT SESSION: manual to improve cervical rotation, work on balance, and habituation.  Give HEP each time as pt can only afford to come once every other week.    Virgina Organ, PT CLT [phone]  03/11/2023, 4:11 PM

## 2023-03-17 DIAGNOSIS — H2513 Age-related nuclear cataract, bilateral: Secondary | ICD-10-CM | POA: Diagnosis not present

## 2023-04-13 ENCOUNTER — Ambulatory Visit (HOSPITAL_COMMUNITY): Payer: Medicare Other | Attending: Internal Medicine | Admitting: Physical Therapy

## 2023-04-13 DIAGNOSIS — R42 Dizziness and giddiness: Secondary | ICD-10-CM | POA: Diagnosis not present

## 2023-04-13 NOTE — Therapy (Signed)
OUTPATIENT PHYSICAL THERAPY VESTIBULAR Treatment      Patient Name: Teresa Anderson MRN: [id number] DOB:09/05/1940, 82 y.o., female Today's Date: 04/13/2023  END OF SESSION:  PT End of Session - 04/13/23 1225     Visit Number 2    Number of Visits 4    Date for PT Re-Evaluation 05/06/23    Authorization Type [employer]    Progress Note Due on Visit 4    PT Start Time 1148    PT Stop Time 1226    PT Time Calculation (min) 38 min    Activity Tolerance Patient tolerated treatment well    Behavior During Therapy Restless;Anxious              Past Medical History:  Diagnosis Date   Allergy    Arthritis    Asthma    Blood transfusion without reported diagnosis    with cancer surgery   Cancer (HCC)    breast and skin   Diabetes mellitus without complication (HCC)    Hyperlipidemia    Hypertension    Lymphedema of leg    Thyroid disease    Varicose veins of bilateral lower extremities with other complications    Past Surgical History:  Procedure Laterality Date   ABDOMINAL HYSTERECTOMY     BREAST SURGERY     bilat   CESAREAN SECTION     x4   CHOLECYSTECTOMY     COSMETIC SURGERY     reconstruction   MASTECTOMY     bilateral   Patient Active Problem List   Diagnosis Date Noted   Ganglion cyst of flexor tendon sheath of finger 12/03/2018   Pain in finger of right hand 12/02/2018   Sjogren's syndrome with keratoconjunctivitis sicca (HCC) 04/22/2018   Primary osteoarthritis of both hands 04/22/2018   Primary osteoarthritis of both feet 04/22/2018   DDD (degenerative disc disease), lumbar 04/22/2018   Primary osteoarthritis of left shoulder 04/22/2018   Abdominal hernia without obstruction or gangrene 10/12/2016   Acute pain of left shoulder 10/08/2016   Varicose veins of bilateral lower extremities with other complications 06/01/2016   Essential hypertension 03/16/2016   Lymphedema 03/16/2016   Breast cancer in female [facility]) 03/16/2016   Hypothyroidism  03/16/2016   Degenerative joint disease (DJD) of lumbar spine 03/16/2016   History of bilateral knee replacement 03/16/2016   Prediabetes 03/16/2016   Obesity 03/16/2016   Hyperlipidemia 03/16/2016   Chronic pain syndrome 03/16/2016    PCP: Nita Sells REFERRING PROVIDER: Benita Stabile, MD  REFERRING DIAG: R42 (ICD-10-CM) - Dizziness and giddiness  THERAPY DIAG:  Dizziness  ONSET DATE: 08/28/22  Rationale for Evaluation and Treatment: Rehabilitation  SUBJECTIVE:   SUBJECTIVE STATEMENT:  PT states that she is still getting dizzy but not every day.    Sometimes it's once a week sometimes she will go a week without dizziness.  When she gets dizzy it last a minute.  She has been doing the exercises everyday.  She states that they are hard to do.    Evaluation:  Pt states that she gets dizzy randomly.  The last time she became dizzy was about two days ago.  She has fallen once in the past six months.  This has been going on for months Pt accompanied by: self  PERTINENT HISTORY: B TKR, breast cancer with B mastectomy.   PAIN:  Are you having pain? Yes: NPRS scale: 7/10 Pain location: B leg pain but this is not what we will be addressing  Pain description: throbbing   PRECAUTIONS: Fall    WEIGHT BEARING RESTRICTIONS: No  FALLS: Has patient fallen in last 6 months? No  LIVING ENVIRONMENT: Lives with: lives alone Lives in: House/apartment Stairs: No Has following equipment at home: None  PLOF: Independent  PATIENT GOALS: to have no more dizziness   OBJECTIVE:    COGNITION: Overall cognitive status: Within functional limits for tasks assessed     Cervical ROM:    Active A/PROM (deg) eval 04/13/23  Flexion    Extension    Right lateral flexion    Left lateral flexion    Right rotation 77 77  Left rotation 35 40  (Blank rows = not tested)     VESTIBULAR ASSESSMENT:   SYMPTOM BEHAVIOR:  Subjective history: PT states that her sx of dizziness is very  variable.  She mainly becomes dizzy if she is standing and carrying items.   Non-Vestibular symptoms:  spasms behind her eyes.  This is variable as well sometimes daily and sometimes every two weeks.   Type of dizziness: Imbalance (Disequilibrium) and Unsteady with head/body turns  Frequency: spasms behind her eyes.  This is variable as well sometimes daily and sometimes every two weeks.   Duration: less than a minute   Aggravating factors: No known aggravating factors  Relieving factors: no known relieving factors  Progression of symptoms: unchanged  OCULOMOTOR EXAM:  Ocular Alignment: normal  Ocular ROM: No Limitations  Spontaneous Nystagmus: absent  Gaze-Induced Nystagmus: absent- to the left makes her eyes ache but no dizziness   Smooth Pursuits: intact  Saccades: intact  VESTIBULAR - OCULAR REFLEX:   Slow VOR: Normal  VOR Cancellation: Unable to Maintain Gaze  Head-Impulse Test: negative   POSITIONAL TESTING: Other: PT is unable to tolerate any testing  Single leg stance: RT: 4   Lt : 1  MOTION SENSITIVITY:  Motion Sensitivity Quotient Intensity: 0 = none, 1 = Lightheaded, 2 = Mild, 3 = Moderate, 4 = Severe, 5 = Vomiting  Intensity 04/13/23   1. Sitting to supine   2. Supine to L side 2  3. Supine to R side   4. Supine to sitting   5. L Hallpike-Dix   6. Up from L    7. R Hallpike-Dix   8. Up from R    9. Sitting, head tipped to L knee 0  10. Head up from L knee 0  11. Sitting, head tipped to R knee 0  12. Head up from R knee 0  13. Sitting head turns x5   14.Sitting head nods x5   15. In stance, 180 turn to L  0  16. In stance, 180 turn to R 0     Foto: 52   VESTIBULAR TREATMENT:                                                                                                   DATE:  04/13/23 habituation testing see above.   Walking with head turns.   Supine to Lt side lying x 5 Tandem stance 30" B x 3 Standing  head turns x 10  1/4 turns while standing   03/11/23 Gaze Adaptation:  x1 Viewing Horizontal: Position: sitting  Habituation:  Compensatory Saccades: number of reps: 5 Other: single leg stance Cervical rotation   PATIENT EDUCATION: Education details: HEP Person educated: Patient Education method: Explanation and Handouts Education comprehension: verbalized understanding  HOME EXERCISE PROGRAM: Access Code: [email or web address] URL: [email or web address] Date: 03/11/2023 Prepared by: Virgina Organ  Exercises - Seated Cervical Rotation AROM  - 3 x daily - 7 x weekly - 1 sets - 5 reps - 5-10 seconds  hold - Standing Single Leg Stance with Counter Support  - 3 x daily - 7 x weekly - 1 sets - 5 reps - as long as possible  hold 04/13/23 Access Code: [email or web address] URL: [email or web address] Date: 04/13/2023 Prepared by: Virgina Organ  Exercises - Supine to Left Sidelying Vestibular Habituation  - 5 x daily - 7 x weekly - 1 sets - 5 reps - 3 hold - Tandem Stance  - 2 x daily - 7 x weekly - 5 sets - 5 reps - as long as possible  hold - Standing with Head Rotation  - 2 x daily - 7 x weekly - 2 sets - 5 reps GOALS: Goals reviewed with patient? Yes  SHORT TERM GOALS: Target date: 04/08/23  PT to be I in her HEP in order to decrease her sx of dizziness by 50% to reduce risk of falling  Baseline: Goal status: IN PROGRESS  2.  PT to be able to single leg stance  B for 10 seconds to reduce risk of falling  Baseline:  Goal status: IN PROGRESS  3.  PT to be able to rotate her cervical spine to the left 50 degrees to assist in decreasing dizziness Baseline:  Goal status: IN PROGRESS  LONG TERM GOALS: Target date: 05/06/23  PT to be I in her HEP in order to decrease her sx of dizziness by 75% to reduce risk of falling  Baseline:  Goal status: IN PROGRESS  2.  PT to be able to single leg stance  B for 15 seconds to reduce risk of falling  Baseline:  Goal status: IN PROGRESS  3.  PT cervical rotation to be  at least 60 degrees  Baseline:  Goal status: IN PROGRESS    ASSESSMENT:  CLINICAL IMPRESSION:  Pt states there is no rhyme or reason to her dizziness.  Therapist tested positional patterns to try and replicate dizziness. Updated HEP.   Patient is a 82 y.o. female  who was seen today for physical therapy evaluation and treatment for dizziness.  Evaluation demonstrates decreased cervical ROM, decreased balance and dizziness.  Ms. Huso will benefit from skilled therapy to address these issues and reduce her risk of falling.    OBJECTIVE IMPAIRMENTS: decreased balance and decreased ROM.   ACTIVITY LIMITATIONS: carrying, lifting, and locomotion level  PARTICIPATION LIMITATIONS: community activity  PERSONAL FACTORS: Behavior pattern and 3+ comorbidities: chronic knee, shoulder and neck pain   are also affecting patient's functional outcome.   REHAB POTENTIAL: Fair    CLINICAL DECISION MAKING: Stable/uncomplicated  EVALUATION COMPLEXITY: Moderate   PLAN:  PT FREQUENCY: every other week  PT DURATION: 8 weeks  PLANNED INTERVENTIONS: Therapeutic exercises, Neuromuscular re-education, Balance training, Patient/Family education, Self Care, and Manual therapy  PLAN FOR NEXT SESSION: manual to improve cervical rotation, work on balance, and habituation.  Give HEP each time as pt can only afford to come once every other week.    Aram Beecham  Perlie Gold, PT CLT [phone]  04/13/2023, 12:26 PM

## 2023-04-14 DIAGNOSIS — M19012 Primary osteoarthritis, left shoulder: Secondary | ICD-10-CM | POA: Diagnosis not present

## 2023-05-10 DIAGNOSIS — E039 Hypothyroidism, unspecified: Secondary | ICD-10-CM | POA: Diagnosis not present

## 2023-05-10 DIAGNOSIS — E1169 Type 2 diabetes mellitus with other specified complication: Secondary | ICD-10-CM | POA: Diagnosis not present

## 2023-05-10 DIAGNOSIS — I10 Essential (primary) hypertension: Secondary | ICD-10-CM | POA: Diagnosis not present

## 2023-05-13 ENCOUNTER — Encounter (HOSPITAL_COMMUNITY): Payer: Self-pay | Admitting: Physical Therapy

## 2023-05-13 NOTE — Therapy (Unsigned)
PHYSICAL THERAPY DISCHARGE SUMMARY  Visits from Start of Care: 2  Current functional level related to goals / functional outcomes: Unknown as pt did not return to clinic    Remaining deficits: Unknown as pt did not return to clinic    Education / Equipment: HEP   Patient agrees to discharge. Patient goals were  unknown due . Patient is being discharged due to not returning since the last visit.   Virgina Organ, PT CLT [phone]

## 2023-05-14 DIAGNOSIS — R944 Abnormal results of kidney function studies: Secondary | ICD-10-CM | POA: Diagnosis not present

## 2023-05-14 DIAGNOSIS — H539 Unspecified visual disturbance: Secondary | ICD-10-CM | POA: Diagnosis not present

## 2023-05-14 DIAGNOSIS — E039 Hypothyroidism, unspecified: Secondary | ICD-10-CM | POA: Diagnosis not present

## 2023-05-14 DIAGNOSIS — I739 Peripheral vascular disease, unspecified: Secondary | ICD-10-CM | POA: Diagnosis not present

## 2023-05-14 DIAGNOSIS — E1169 Type 2 diabetes mellitus with other specified complication: Secondary | ICD-10-CM | POA: Diagnosis not present

## 2023-05-14 DIAGNOSIS — E782 Mixed hyperlipidemia: Secondary | ICD-10-CM | POA: Diagnosis not present

## 2023-05-14 DIAGNOSIS — G4700 Insomnia, unspecified: Secondary | ICD-10-CM | POA: Diagnosis not present

## 2023-05-14 DIAGNOSIS — G894 Chronic pain syndrome: Secondary | ICD-10-CM | POA: Diagnosis not present

## 2023-05-14 DIAGNOSIS — I10 Essential (primary) hypertension: Secondary | ICD-10-CM | POA: Diagnosis not present

## 2023-05-14 DIAGNOSIS — M79605 Pain in left leg: Secondary | ICD-10-CM | POA: Diagnosis not present

## 2023-05-14 DIAGNOSIS — M79604 Pain in right leg: Secondary | ICD-10-CM | POA: Diagnosis not present

## 2023-06-02 ENCOUNTER — Ambulatory Visit: Payer: Medicare Other | Admitting: Podiatry

## 2023-06-08 ENCOUNTER — Ambulatory Visit: Payer: Medicare Other | Admitting: Podiatry

## 2023-06-10 ENCOUNTER — Ambulatory Visit: Payer: Medicare Other | Admitting: Podiatry

## 2023-06-11 DIAGNOSIS — Z79899 Other long term (current) drug therapy: Secondary | ICD-10-CM | POA: Diagnosis not present

## 2023-06-11 DIAGNOSIS — G2581 Restless legs syndrome: Secondary | ICD-10-CM | POA: Diagnosis not present

## 2023-06-11 DIAGNOSIS — R252 Cramp and spasm: Secondary | ICD-10-CM | POA: Diagnosis not present

## 2023-06-18 ENCOUNTER — Ambulatory Visit: Payer: Medicare Other | Admitting: Podiatry

## 2023-06-18 ENCOUNTER — Encounter: Payer: Self-pay | Admitting: Podiatry

## 2023-06-18 DIAGNOSIS — L84 Corns and callosities: Secondary | ICD-10-CM | POA: Diagnosis not present

## 2023-06-18 DIAGNOSIS — M79675 Pain in left toe(s): Secondary | ICD-10-CM

## 2023-06-18 DIAGNOSIS — M2011 Hallux valgus (acquired), right foot: Secondary | ICD-10-CM | POA: Diagnosis not present

## 2023-06-18 DIAGNOSIS — M2042 Other hammer toe(s) (acquired), left foot: Secondary | ICD-10-CM | POA: Diagnosis not present

## 2023-06-18 DIAGNOSIS — M2041 Other hammer toe(s) (acquired), right foot: Secondary | ICD-10-CM | POA: Diagnosis not present

## 2023-06-18 DIAGNOSIS — B351 Tinea unguium: Secondary | ICD-10-CM | POA: Insufficient documentation

## 2023-06-18 DIAGNOSIS — M2032 Hallux varus (acquired), left foot: Secondary | ICD-10-CM | POA: Diagnosis not present

## 2023-06-18 DIAGNOSIS — M79674 Pain in right toe(s): Secondary | ICD-10-CM | POA: Diagnosis not present

## 2023-06-18 DIAGNOSIS — M2030 Hallux varus (acquired), unspecified foot: Secondary | ICD-10-CM | POA: Insufficient documentation

## 2023-06-18 NOTE — Progress Notes (Signed)
This patient presents to the office with long painful nails and painful corn on second toe right foot.  She says these are painful walking and wearing her shoes.  She was referred to this office by her medical doctor.  He has been diagnosed with lymphedema .  General Appearance  Alert, conversant and in no acute stress.  Vascular  Dorsalis pedis and posterior tibial  pulses are palpable  bilaterally.  Capillary return is within normal limits  bilaterally. Temperature is within normal limits  bilaterally.  Neurologic  Senn-Weinstein monofilament wire test within normal limits  bilaterally. Muscle power within normal limits bilaterally.  Nails Thick disfigured discolored nails with subungual debris  from hallux to fifth toes bilaterally. No evidence of bacterial infection or drainage bilaterally.  Orthopedic  No limitations of motion  feet .  No crepitus or effusions noted.  HAV  B/L.  Skin  normotropic skin with no porokeratosis noted bilaterally.  No signs of infections or ulcers noted.  Onychomycosis  Corn second toe right foot due to overlapping hallux right foot.  IE.  Debride nails with nail nipper and dremel tool.  Debride corn second toe right foot with # 15 blade.   Padding dispensed.  RTC 3 months   Helane Gunther DPM

## 2023-07-20 DIAGNOSIS — R252 Cramp and spasm: Secondary | ICD-10-CM | POA: Diagnosis not present

## 2023-07-20 DIAGNOSIS — M79605 Pain in left leg: Secondary | ICD-10-CM | POA: Diagnosis not present

## 2023-07-20 DIAGNOSIS — M79604 Pain in right leg: Secondary | ICD-10-CM | POA: Diagnosis not present

## 2023-07-20 DIAGNOSIS — G894 Chronic pain syndrome: Secondary | ICD-10-CM | POA: Diagnosis not present

## 2023-07-20 DIAGNOSIS — G2581 Restless legs syndrome: Secondary | ICD-10-CM | POA: Diagnosis not present

## 2023-08-09 DIAGNOSIS — H5203 Hypermetropia, bilateral: Secondary | ICD-10-CM | POA: Diagnosis not present

## 2023-08-09 DIAGNOSIS — H2513 Age-related nuclear cataract, bilateral: Secondary | ICD-10-CM | POA: Diagnosis not present

## 2023-08-11 DIAGNOSIS — M19012 Primary osteoarthritis, left shoulder: Secondary | ICD-10-CM | POA: Diagnosis not present

## 2023-09-08 ENCOUNTER — Other Ambulatory Visit (HOSPITAL_COMMUNITY): Payer: Self-pay | Admitting: Family Medicine

## 2023-09-08 DIAGNOSIS — R1032 Left lower quadrant pain: Secondary | ICD-10-CM

## 2023-10-18 DIAGNOSIS — I10 Essential (primary) hypertension: Secondary | ICD-10-CM | POA: Diagnosis not present

## 2023-10-18 DIAGNOSIS — E039 Hypothyroidism, unspecified: Secondary | ICD-10-CM | POA: Diagnosis not present

## 2023-10-18 DIAGNOSIS — E1169 Type 2 diabetes mellitus with other specified complication: Secondary | ICD-10-CM | POA: Diagnosis not present

## 2023-10-25 DIAGNOSIS — G2581 Restless legs syndrome: Secondary | ICD-10-CM | POA: Diagnosis not present

## 2023-10-25 DIAGNOSIS — E039 Hypothyroidism, unspecified: Secondary | ICD-10-CM | POA: Diagnosis not present

## 2023-10-25 DIAGNOSIS — M79604 Pain in right leg: Secondary | ICD-10-CM | POA: Diagnosis not present

## 2023-10-25 DIAGNOSIS — I739 Peripheral vascular disease, unspecified: Secondary | ICD-10-CM | POA: Diagnosis not present

## 2023-10-25 DIAGNOSIS — G894 Chronic pain syndrome: Secondary | ICD-10-CM | POA: Diagnosis not present

## 2023-10-25 DIAGNOSIS — R252 Cramp and spasm: Secondary | ICD-10-CM | POA: Diagnosis not present

## 2023-10-25 DIAGNOSIS — E1169 Type 2 diabetes mellitus with other specified complication: Secondary | ICD-10-CM | POA: Diagnosis not present

## 2023-10-25 DIAGNOSIS — R944 Abnormal results of kidney function studies: Secondary | ICD-10-CM | POA: Diagnosis not present

## 2023-10-25 DIAGNOSIS — E782 Mixed hyperlipidemia: Secondary | ICD-10-CM | POA: Diagnosis not present

## 2023-10-25 DIAGNOSIS — G4700 Insomnia, unspecified: Secondary | ICD-10-CM | POA: Diagnosis not present

## 2023-10-25 DIAGNOSIS — I10 Essential (primary) hypertension: Secondary | ICD-10-CM | POA: Diagnosis not present

## 2023-10-25 DIAGNOSIS — M79605 Pain in left leg: Secondary | ICD-10-CM | POA: Diagnosis not present

## 2023-10-26 DIAGNOSIS — D225 Melanocytic nevi of trunk: Secondary | ICD-10-CM | POA: Diagnosis not present

## 2023-10-26 DIAGNOSIS — L2989 Other pruritus: Secondary | ICD-10-CM | POA: Diagnosis not present

## 2023-10-26 DIAGNOSIS — L820 Inflamed seborrheic keratosis: Secondary | ICD-10-CM | POA: Diagnosis not present

## 2023-10-26 DIAGNOSIS — L538 Other specified erythematous conditions: Secondary | ICD-10-CM | POA: Diagnosis not present

## 2023-10-26 DIAGNOSIS — L821 Other seborrheic keratosis: Secondary | ICD-10-CM | POA: Diagnosis not present

## 2023-10-26 DIAGNOSIS — Z789 Other specified health status: Secondary | ICD-10-CM | POA: Diagnosis not present

## 2023-10-26 DIAGNOSIS — L814 Other melanin hyperpigmentation: Secondary | ICD-10-CM | POA: Diagnosis not present

## 2023-11-08 DIAGNOSIS — M19012 Primary osteoarthritis, left shoulder: Secondary | ICD-10-CM | POA: Diagnosis not present

## 2023-11-09 DIAGNOSIS — G894 Chronic pain syndrome: Secondary | ICD-10-CM | POA: Diagnosis not present

## 2023-11-09 DIAGNOSIS — I10 Essential (primary) hypertension: Secondary | ICD-10-CM | POA: Diagnosis not present

## 2023-11-09 DIAGNOSIS — R42 Dizziness and giddiness: Secondary | ICD-10-CM | POA: Diagnosis not present

## 2023-11-09 DIAGNOSIS — Z6834 Body mass index (BMI) 34.0-34.9, adult: Secondary | ICD-10-CM | POA: Diagnosis not present

## 2023-11-09 DIAGNOSIS — G2581 Restless legs syndrome: Secondary | ICD-10-CM | POA: Diagnosis not present

## 2023-12-03 DIAGNOSIS — Z713 Dietary counseling and surveillance: Secondary | ICD-10-CM | POA: Diagnosis not present

## 2023-12-03 DIAGNOSIS — H1031 Unspecified acute conjunctivitis, right eye: Secondary | ICD-10-CM | POA: Diagnosis not present

## 2023-12-03 DIAGNOSIS — Z79899 Other long term (current) drug therapy: Secondary | ICD-10-CM | POA: Diagnosis not present

## 2023-12-03 DIAGNOSIS — G2581 Restless legs syndrome: Secondary | ICD-10-CM | POA: Diagnosis not present

## 2023-12-03 DIAGNOSIS — J302 Other seasonal allergic rhinitis: Secondary | ICD-10-CM | POA: Diagnosis not present

## 2023-12-03 DIAGNOSIS — I10 Essential (primary) hypertension: Secondary | ICD-10-CM | POA: Diagnosis not present

## 2023-12-03 DIAGNOSIS — G894 Chronic pain syndrome: Secondary | ICD-10-CM | POA: Diagnosis not present

## 2023-12-10 DIAGNOSIS — X58XXXA Exposure to other specified factors, initial encounter: Secondary | ICD-10-CM | POA: Diagnosis not present

## 2023-12-10 DIAGNOSIS — S61311A Laceration without foreign body of left index finger with damage to nail, initial encounter: Secondary | ICD-10-CM | POA: Diagnosis not present

## 2023-12-17 DIAGNOSIS — S61311A Laceration without foreign body of left index finger with damage to nail, initial encounter: Secondary | ICD-10-CM | POA: Diagnosis not present

## 2023-12-17 DIAGNOSIS — S61311D Laceration without foreign body of left index finger with damage to nail, subsequent encounter: Secondary | ICD-10-CM | POA: Diagnosis not present

## 2024-01-04 DIAGNOSIS — Z961 Presence of intraocular lens: Secondary | ICD-10-CM | POA: Diagnosis not present

## 2024-01-04 DIAGNOSIS — H25811 Combined forms of age-related cataract, right eye: Secondary | ICD-10-CM | POA: Diagnosis not present

## 2024-01-04 DIAGNOSIS — H2511 Age-related nuclear cataract, right eye: Secondary | ICD-10-CM | POA: Diagnosis not present

## 2024-01-05 DIAGNOSIS — I350 Nonrheumatic aortic (valve) stenosis: Secondary | ICD-10-CM | POA: Diagnosis not present

## 2024-01-05 DIAGNOSIS — Z713 Dietary counseling and surveillance: Secondary | ICD-10-CM | POA: Diagnosis not present

## 2024-01-05 DIAGNOSIS — G2581 Restless legs syndrome: Secondary | ICD-10-CM | POA: Diagnosis not present

## 2024-01-05 DIAGNOSIS — Z79899 Other long term (current) drug therapy: Secondary | ICD-10-CM | POA: Diagnosis not present

## 2024-01-05 DIAGNOSIS — G894 Chronic pain syndrome: Secondary | ICD-10-CM | POA: Diagnosis not present

## 2024-01-05 DIAGNOSIS — R0789 Other chest pain: Secondary | ICD-10-CM | POA: Diagnosis not present

## 2024-01-05 DIAGNOSIS — I10 Essential (primary) hypertension: Secondary | ICD-10-CM | POA: Diagnosis not present

## 2024-01-05 DIAGNOSIS — Z7182 Exercise counseling: Secondary | ICD-10-CM | POA: Diagnosis not present

## 2024-01-05 DIAGNOSIS — Z6835 Body mass index (BMI) 35.0-35.9, adult: Secondary | ICD-10-CM | POA: Diagnosis not present

## 2024-01-05 DIAGNOSIS — J302 Other seasonal allergic rhinitis: Secondary | ICD-10-CM | POA: Diagnosis not present

## 2024-01-18 DIAGNOSIS — H25812 Combined forms of age-related cataract, left eye: Secondary | ICD-10-CM | POA: Diagnosis not present

## 2024-01-18 DIAGNOSIS — H2512 Age-related nuclear cataract, left eye: Secondary | ICD-10-CM | POA: Diagnosis not present

## 2024-01-18 DIAGNOSIS — Z961 Presence of intraocular lens: Secondary | ICD-10-CM | POA: Diagnosis not present

## 2024-01-24 DIAGNOSIS — Z713 Dietary counseling and surveillance: Secondary | ICD-10-CM | POA: Diagnosis not present

## 2024-01-24 DIAGNOSIS — Z7182 Exercise counseling: Secondary | ICD-10-CM | POA: Diagnosis not present

## 2024-01-24 DIAGNOSIS — G2581 Restless legs syndrome: Secondary | ICD-10-CM | POA: Diagnosis not present

## 2024-01-24 DIAGNOSIS — G894 Chronic pain syndrome: Secondary | ICD-10-CM | POA: Diagnosis not present

## 2024-01-24 DIAGNOSIS — Z79899 Other long term (current) drug therapy: Secondary | ICD-10-CM | POA: Diagnosis not present

## 2024-01-24 DIAGNOSIS — Z6835 Body mass index (BMI) 35.0-35.9, adult: Secondary | ICD-10-CM | POA: Diagnosis not present

## 2024-01-24 DIAGNOSIS — I10 Essential (primary) hypertension: Secondary | ICD-10-CM | POA: Diagnosis not present

## 2024-02-09 ENCOUNTER — Other Ambulatory Visit: Payer: Self-pay | Admitting: Orthopedic Surgery

## 2024-02-09 DIAGNOSIS — M19012 Primary osteoarthritis, left shoulder: Secondary | ICD-10-CM | POA: Diagnosis not present

## 2024-02-09 DIAGNOSIS — G8929 Other chronic pain: Secondary | ICD-10-CM

## 2024-03-01 ENCOUNTER — Other Ambulatory Visit

## 2024-03-01 DIAGNOSIS — L300 Nummular dermatitis: Secondary | ICD-10-CM | POA: Diagnosis not present

## 2024-04-19 DIAGNOSIS — I10 Essential (primary) hypertension: Secondary | ICD-10-CM | POA: Diagnosis not present

## 2024-04-19 DIAGNOSIS — E039 Hypothyroidism, unspecified: Secondary | ICD-10-CM | POA: Diagnosis not present

## 2024-04-25 DIAGNOSIS — M79604 Pain in right leg: Secondary | ICD-10-CM | POA: Diagnosis not present

## 2024-04-25 DIAGNOSIS — I350 Nonrheumatic aortic (valve) stenosis: Secondary | ICD-10-CM | POA: Diagnosis not present

## 2024-04-25 DIAGNOSIS — G894 Chronic pain syndrome: Secondary | ICD-10-CM | POA: Diagnosis not present

## 2024-04-25 DIAGNOSIS — I10 Essential (primary) hypertension: Secondary | ICD-10-CM | POA: Diagnosis not present

## 2024-04-25 DIAGNOSIS — Z0000 Encounter for general adult medical examination without abnormal findings: Secondary | ICD-10-CM | POA: Diagnosis not present

## 2024-04-25 DIAGNOSIS — E039 Hypothyroidism, unspecified: Secondary | ICD-10-CM | POA: Diagnosis not present

## 2024-04-25 DIAGNOSIS — Z0001 Encounter for general adult medical examination with abnormal findings: Secondary | ICD-10-CM | POA: Diagnosis not present

## 2024-04-25 DIAGNOSIS — E782 Mixed hyperlipidemia: Secondary | ICD-10-CM | POA: Diagnosis not present

## 2024-04-25 DIAGNOSIS — G2581 Restless legs syndrome: Secondary | ICD-10-CM | POA: Diagnosis not present

## 2024-04-25 DIAGNOSIS — E1169 Type 2 diabetes mellitus with other specified complication: Secondary | ICD-10-CM | POA: Diagnosis not present

## 2024-04-25 DIAGNOSIS — R252 Cramp and spasm: Secondary | ICD-10-CM | POA: Diagnosis not present

## 2024-05-10 ENCOUNTER — Other Ambulatory Visit: Payer: Self-pay | Admitting: Internal Medicine

## 2024-05-10 DIAGNOSIS — G894 Chronic pain syndrome: Secondary | ICD-10-CM

## 2024-05-10 DIAGNOSIS — I739 Peripheral vascular disease, unspecified: Secondary | ICD-10-CM | POA: Diagnosis not present

## 2024-05-10 DIAGNOSIS — M79604 Pain in right leg: Secondary | ICD-10-CM | POA: Diagnosis not present

## 2024-05-10 DIAGNOSIS — Z713 Dietary counseling and surveillance: Secondary | ICD-10-CM | POA: Diagnosis not present

## 2024-05-10 DIAGNOSIS — G2581 Restless legs syndrome: Secondary | ICD-10-CM | POA: Diagnosis not present

## 2024-05-10 DIAGNOSIS — Z7182 Exercise counseling: Secondary | ICD-10-CM | POA: Diagnosis not present

## 2024-05-10 DIAGNOSIS — M79605 Pain in left leg: Secondary | ICD-10-CM | POA: Diagnosis not present

## 2024-05-10 DIAGNOSIS — Z6835 Body mass index (BMI) 35.0-35.9, adult: Secondary | ICD-10-CM | POA: Diagnosis not present

## 2024-05-10 DIAGNOSIS — R252 Cramp and spasm: Secondary | ICD-10-CM | POA: Diagnosis not present

## 2024-05-10 DIAGNOSIS — I10 Essential (primary) hypertension: Secondary | ICD-10-CM | POA: Diagnosis not present

## 2024-05-19 DIAGNOSIS — G894 Chronic pain syndrome: Secondary | ICD-10-CM | POA: Diagnosis not present

## 2024-05-19 DIAGNOSIS — R252 Cramp and spasm: Secondary | ICD-10-CM | POA: Diagnosis not present

## 2024-05-19 DIAGNOSIS — Z6835 Body mass index (BMI) 35.0-35.9, adult: Secondary | ICD-10-CM | POA: Diagnosis not present

## 2024-05-19 DIAGNOSIS — G2581 Restless legs syndrome: Secondary | ICD-10-CM | POA: Diagnosis not present

## 2024-05-19 DIAGNOSIS — I10 Essential (primary) hypertension: Secondary | ICD-10-CM | POA: Diagnosis not present

## 2024-05-19 DIAGNOSIS — I739 Peripheral vascular disease, unspecified: Secondary | ICD-10-CM | POA: Diagnosis not present

## 2024-05-19 DIAGNOSIS — M79604 Pain in right leg: Secondary | ICD-10-CM | POA: Diagnosis not present

## 2024-05-19 DIAGNOSIS — M79605 Pain in left leg: Secondary | ICD-10-CM | POA: Diagnosis not present

## 2024-05-24 DIAGNOSIS — M19012 Primary osteoarthritis, left shoulder: Secondary | ICD-10-CM | POA: Diagnosis not present
